# Patient Record
Sex: Female | Born: 1945 | Hispanic: No | Marital: Married | Smoking: Never smoker
Health system: Southern US, Community
[De-identification: ages and names within clinical notes are randomized; demographics above are authoritative.]

## PROBLEM LIST (undated history)

## (undated) DIAGNOSIS — M8580 Other specified disorders of bone density and structure, unspecified site: Secondary | ICD-10-CM

## (undated) DIAGNOSIS — D219 Benign neoplasm of connective and other soft tissue, unspecified: Secondary | ICD-10-CM

## (undated) DIAGNOSIS — E7800 Pure hypercholesterolemia, unspecified: Secondary | ICD-10-CM

## (undated) DIAGNOSIS — K219 Gastro-esophageal reflux disease without esophagitis: Secondary | ICD-10-CM

## (undated) DIAGNOSIS — B009 Herpesviral infection, unspecified: Secondary | ICD-10-CM

## (undated) DIAGNOSIS — J189 Pneumonia, unspecified organism: Secondary | ICD-10-CM

## (undated) HISTORY — PX: BREAST SURGERY: SHX581

## (undated) HISTORY — DX: Gastro-esophageal reflux disease without esophagitis: K21.9

## (undated) HISTORY — DX: Benign neoplasm of connective and other soft tissue, unspecified: D21.9

## (undated) HISTORY — PX: TONSILLECTOMY: SUR1361

## (undated) HISTORY — PX: VAGINAL HYSTERECTOMY: SUR661

## (undated) HISTORY — DX: Herpesviral infection, unspecified: B00.9

## (undated) HISTORY — DX: Pure hypercholesterolemia, unspecified: E78.00

## (undated) HISTORY — PX: OTHER SURGICAL HISTORY: SHX169

## (undated) HISTORY — DX: Pneumonia, unspecified organism: J18.9

## (undated) HISTORY — DX: Other specified disorders of bone density and structure, unspecified site: M85.80

---

## 1998-05-05 ENCOUNTER — Other Ambulatory Visit: Admission: RE | Admit: 1998-05-05 | Discharge: 1998-05-05 | Payer: Self-pay | Admitting: Obstetrics and Gynecology

## 1999-10-09 ENCOUNTER — Other Ambulatory Visit: Admission: RE | Admit: 1999-10-09 | Discharge: 1999-10-09 | Payer: Self-pay | Admitting: Obstetrics and Gynecology

## 2000-10-24 ENCOUNTER — Other Ambulatory Visit: Admission: RE | Admit: 2000-10-24 | Discharge: 2000-10-24 | Payer: Self-pay | Admitting: Obstetrics and Gynecology

## 2002-02-13 ENCOUNTER — Other Ambulatory Visit: Admission: RE | Admit: 2002-02-13 | Discharge: 2002-02-13 | Payer: Self-pay | Admitting: Obstetrics and Gynecology

## 2003-11-14 ENCOUNTER — Other Ambulatory Visit: Admission: RE | Admit: 2003-11-14 | Discharge: 2003-11-14 | Payer: Self-pay | Admitting: Obstetrics and Gynecology

## 2004-04-20 ENCOUNTER — Ambulatory Visit (HOSPITAL_COMMUNITY): Admission: RE | Admit: 2004-04-20 | Discharge: 2004-04-20 | Payer: Self-pay | Admitting: *Deleted

## 2004-12-28 ENCOUNTER — Other Ambulatory Visit: Admission: RE | Admit: 2004-12-28 | Discharge: 2004-12-28 | Payer: Self-pay | Admitting: Obstetrics and Gynecology

## 2006-02-28 ENCOUNTER — Other Ambulatory Visit: Admission: RE | Admit: 2006-02-28 | Discharge: 2006-02-28 | Payer: Self-pay | Admitting: Obstetrics and Gynecology

## 2007-04-03 ENCOUNTER — Other Ambulatory Visit: Admission: RE | Admit: 2007-04-03 | Discharge: 2007-04-03 | Payer: Self-pay | Admitting: Obstetrics and Gynecology

## 2008-04-30 ENCOUNTER — Other Ambulatory Visit: Admission: RE | Admit: 2008-04-30 | Discharge: 2008-04-30 | Payer: Self-pay | Admitting: Obstetrics and Gynecology

## 2008-04-30 ENCOUNTER — Ambulatory Visit: Payer: Self-pay | Admitting: Obstetrics and Gynecology

## 2008-04-30 ENCOUNTER — Encounter: Payer: Self-pay | Admitting: Obstetrics and Gynecology

## 2009-05-15 ENCOUNTER — Ambulatory Visit: Payer: Self-pay | Admitting: Obstetrics and Gynecology

## 2009-05-15 ENCOUNTER — Other Ambulatory Visit: Admission: RE | Admit: 2009-05-15 | Discharge: 2009-05-15 | Payer: Self-pay | Admitting: Obstetrics and Gynecology

## 2009-05-27 ENCOUNTER — Ambulatory Visit: Payer: Self-pay | Admitting: Obstetrics and Gynecology

## 2010-06-29 ENCOUNTER — Other Ambulatory Visit: Payer: Self-pay | Admitting: Obstetrics and Gynecology

## 2010-06-29 ENCOUNTER — Other Ambulatory Visit (HOSPITAL_COMMUNITY)
Admission: RE | Admit: 2010-06-29 | Discharge: 2010-06-29 | Disposition: A | Discharge status: Home / Self Care | Payer: MEDICARE | Source: Ambulatory Visit | Attending: Obstetrics and Gynecology | Admitting: Obstetrics and Gynecology

## 2010-06-29 ENCOUNTER — Encounter (INDEPENDENT_AMBULATORY_CARE_PROVIDER_SITE_OTHER): Payer: MEDICARE | Admitting: Obstetrics and Gynecology

## 2010-06-29 DIAGNOSIS — N951 Menopausal and female climacteric states: Secondary | ICD-10-CM

## 2010-06-29 DIAGNOSIS — N952 Postmenopausal atrophic vaginitis: Secondary | ICD-10-CM

## 2010-06-29 DIAGNOSIS — B373 Candidiasis of vulva and vagina: Secondary | ICD-10-CM

## 2010-06-29 DIAGNOSIS — E559 Vitamin D deficiency, unspecified: Secondary | ICD-10-CM

## 2010-06-29 DIAGNOSIS — Z124 Encounter for screening for malignant neoplasm of cervix: Secondary | ICD-10-CM

## 2010-06-29 DIAGNOSIS — R350 Frequency of micturition: Secondary | ICD-10-CM

## 2010-08-28 NOTE — Op Note (Signed)
NAMEHARRIETT, [name]:  [id number]   MEDICAL RECORD NO.:  [id number]          PATIENT TYPE:  AMB   LOCATION:  ENDO                         FACILITY:  [facility]   PHYSICIAN:  Georgiana Spinner, M.D.    DATE OF BIRTH:  01/27/1946   DATE OF PROCEDURE:  04/20/2004  DATE OF DISCHARGE:                                 OPERATIVE REPORT   PROCEDURE:  Colonoscopy.   INDICATION:  Cancer screening.   ANESTHESIA:  Demerol 60, Versed 7 mg.   PROCEDURE:  With the patient mildly sedated in the left lateral decubitus  position, the Olympus videoscopic colonoscope was inserted into the rectum  and advanced under direct vision to the cecum identified by the base of the  cecum which was photographed and ileocecal valve.  From this point, the  colonoscope was slowly withdrawn taking circumferential views of colonic  mucosa, stopping in the rectum which appeared normal and the rectum showed  hemorrhoids in retroflexed view.  The endoscope was straightened and  withdrawn.  The patient's vital signs and pulse oximetry remained stable.  The patient tolerated the procedure well without apparent complications.   FINDINGS:  Internal hemorrhoids, otherwise an unremarkable colonoscopic  examination to the cecum.   PLAN:  Consider repeat examination in 5-10 years.      GMO/MEDQ  D:  04/20/2004  T:  04/20/2004  Job:  [id number]

## 2011-04-13 DIAGNOSIS — J189 Pneumonia, unspecified organism: Secondary | ICD-10-CM

## 2011-04-13 HISTORY — DX: Pneumonia, unspecified organism: J18.9

## 2011-07-08 DIAGNOSIS — J069 Acute upper respiratory infection, unspecified: Secondary | ICD-10-CM | POA: Diagnosis not present

## 2011-07-19 DIAGNOSIS — J209 Acute bronchitis, unspecified: Secondary | ICD-10-CM | POA: Diagnosis not present

## 2011-07-19 DIAGNOSIS — R509 Fever, unspecified: Secondary | ICD-10-CM | POA: Diagnosis not present

## 2011-07-19 DIAGNOSIS — J984 Other disorders of lung: Secondary | ICD-10-CM | POA: Diagnosis not present

## 2011-07-22 DIAGNOSIS — J189 Pneumonia, unspecified organism: Secondary | ICD-10-CM | POA: Diagnosis not present

## 2011-07-22 DIAGNOSIS — R062 Wheezing: Secondary | ICD-10-CM | POA: Diagnosis not present

## 2011-07-28 DIAGNOSIS — J189 Pneumonia, unspecified organism: Secondary | ICD-10-CM | POA: Diagnosis not present

## 2011-08-06 DIAGNOSIS — R059 Cough, unspecified: Secondary | ICD-10-CM | POA: Diagnosis not present

## 2011-08-06 DIAGNOSIS — R5381 Other malaise: Secondary | ICD-10-CM | POA: Diagnosis not present

## 2011-08-06 DIAGNOSIS — R5383 Other fatigue: Secondary | ICD-10-CM | POA: Diagnosis not present

## 2011-08-06 DIAGNOSIS — H10029 Other mucopurulent conjunctivitis, unspecified eye: Secondary | ICD-10-CM | POA: Diagnosis not present

## 2011-08-06 DIAGNOSIS — J029 Acute pharyngitis, unspecified: Secondary | ICD-10-CM | POA: Diagnosis not present

## 2011-08-06 DIAGNOSIS — R05 Cough: Secondary | ICD-10-CM | POA: Diagnosis not present

## 2011-08-12 DIAGNOSIS — H109 Unspecified conjunctivitis: Secondary | ICD-10-CM | POA: Diagnosis not present

## 2011-08-12 DIAGNOSIS — G4700 Insomnia, unspecified: Secondary | ICD-10-CM | POA: Diagnosis not present

## 2011-08-12 DIAGNOSIS — J189 Pneumonia, unspecified organism: Secondary | ICD-10-CM | POA: Diagnosis not present

## 2011-09-13 ENCOUNTER — Encounter: Payer: Self-pay | Admitting: Gynecology

## 2011-09-13 DIAGNOSIS — B009 Herpesviral infection, unspecified: Secondary | ICD-10-CM | POA: Insufficient documentation

## 2011-09-16 DIAGNOSIS — Z1231 Encounter for screening mammogram for malignant neoplasm of breast: Secondary | ICD-10-CM | POA: Diagnosis not present

## 2011-09-23 ENCOUNTER — Encounter: Payer: Self-pay | Admitting: Obstetrics and Gynecology

## 2011-09-24 ENCOUNTER — Ambulatory Visit (INDEPENDENT_AMBULATORY_CARE_PROVIDER_SITE_OTHER): Payer: MEDICARE | Admitting: Obstetrics and Gynecology

## 2011-09-24 ENCOUNTER — Encounter: Payer: Self-pay | Admitting: Obstetrics and Gynecology

## 2011-09-24 VITALS — BP 124/70 | Ht 64.0 in | Wt 142.0 lb

## 2011-09-24 DIAGNOSIS — R350 Frequency of micturition: Secondary | ICD-10-CM

## 2011-09-24 DIAGNOSIS — E7800 Pure hypercholesterolemia, unspecified: Secondary | ICD-10-CM | POA: Insufficient documentation

## 2011-09-24 NOTE — Progress Notes (Signed)
Patient came to see me today for further followup. She continues to get hot flashes but feels they're tolerable without hormone replacement. She is on Zoloft and I suspect that modifiers her symptoms. She does have vaginal dryness and is using her replens with good results. She is having no vaginal bleeding. She is having no pelvic pain. She does have urinary frequency without nocturia. She will lose urine with coughing with an upper respiratory infection and does do Kegel's. She is having no dysuria or urgency. She is up-to-date on mammograms. She's had 2 normal bone densities. She does her lab through PCP. She just was treated for pneumonia which required 3 different antibiotics to resolve. She is status post vaginal hysterectomy and has never had an abnormal Pap smear. 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  ROS: 12 system review done. Pertinent positives above. Only other positive is hyperlipidemia.  HEENT: Within normal limits. Kennon Portela present. Neck: No masses. Supraclavicular lymph nodes: Not enlarged. Breasts: Examined in both sitting and lying position. Symmetrical without skin changes or masses. Abdomen: Soft no masses guarding or rebound. No hernias. Pelvic: External within normal limits. BUS within normal limits. Vaginal examination shows poor estrogen effect, no cystocele enterocele or rectocele. Cervix and uterus absent. Adnexa within normal limits. Rectovaginal confirmatory. Extremities within normal limits.  Assessment: #1. Menopausal symptoms #2. Atrophic vaginitis #3. Urinary frequency #4. Stress incontinence with upper respiratory infections  Plan: Continue yearly mammograms. Continue replens. Continue Zoloft. Continue Kegel exercises.

## 2011-09-25 LAB — URINALYSIS W MICROSCOPIC + REFLEX CULTURE
Bilirubin Urine: NEGATIVE
Casts: NONE SEEN
Crystals: NONE SEEN
Glucose, UA: NEGATIVE mg/dL
Hgb urine dipstick: NEGATIVE
Ketones, ur: NEGATIVE mg/dL
Leukocytes, UA: NEGATIVE
Nitrite: NEGATIVE
Protein, ur: NEGATIVE mg/dL
Specific Gravity, Urine: 1.012 (ref 1.005–1.030)
Urobilinogen, UA: 0.2 mg/dL (ref 0.0–1.0)
pH: 7.5 (ref 5.0–8.0)

## 2011-10-01 ENCOUNTER — Encounter: Payer: Self-pay | Admitting: Obstetrics and Gynecology

## 2012-01-03 DIAGNOSIS — R002 Palpitations: Secondary | ICD-10-CM | POA: Diagnosis not present

## 2012-01-07 DIAGNOSIS — Z23 Encounter for immunization: Secondary | ICD-10-CM | POA: Diagnosis not present

## 2012-02-02 DIAGNOSIS — D239 Other benign neoplasm of skin, unspecified: Secondary | ICD-10-CM | POA: Diagnosis not present

## 2012-02-02 DIAGNOSIS — L80 Vitiligo: Secondary | ICD-10-CM | POA: Diagnosis not present

## 2012-02-02 DIAGNOSIS — L821 Other seborrheic keratosis: Secondary | ICD-10-CM | POA: Diagnosis not present

## 2012-05-11 DIAGNOSIS — IMO0002 Reserved for concepts with insufficient information to code with codable children: Secondary | ICD-10-CM | POA: Diagnosis not present

## 2012-10-19 DIAGNOSIS — Z1231 Encounter for screening mammogram for malignant neoplasm of breast: Secondary | ICD-10-CM | POA: Diagnosis not present

## 2012-10-20 ENCOUNTER — Encounter: Payer: Self-pay | Admitting: Women's Health

## 2012-10-26 ENCOUNTER — Ambulatory Visit (INDEPENDENT_AMBULATORY_CARE_PROVIDER_SITE_OTHER): Payer: MEDICARE | Admitting: Women's Health

## 2012-10-26 ENCOUNTER — Encounter: Payer: Self-pay | Admitting: Women's Health

## 2012-10-26 VITALS — BP 116/78 | Ht 64.0 in | Wt 145.0 lb

## 2012-10-26 DIAGNOSIS — Z780 Asymptomatic menopausal state: Secondary | ICD-10-CM

## 2012-10-26 DIAGNOSIS — B009 Herpesviral infection, unspecified: Secondary | ICD-10-CM | POA: Diagnosis not present

## 2012-10-26 MED ORDER — VALACYCLOVIR HCL 500 MG PO TABS: 500.0000 mg | ORAL_TABLET | Freq: Two times a day (BID) | ORAL | Status: DC

## 2012-10-26 NOTE — Patient Instructions (Addendum)
Health Recommendations for Postmenopausal Women Respected and ongoing research has looked at the most common causes of death, disability, and poor quality of life in postmenopausal women. The causes include heart disease, diseases of blood vessels, diabetes, depression, cancer, and bone loss (osteoporosis). Many things can be done to help lower the chances of developing these and other common problems: CARDIOVASCULAR DISEASE Heart Disease: A heart attack is a medical emergency. Know the signs and symptoms of a heart attack. Below are things women can do to reduce their risk for heart disease.   Do not smoke. If you smoke, quit.  Aim for a healthy weight. Being overweight causes many preventable deaths. Eat a healthy and balanced diet and drink an adequate amount of liquids.  Get moving. Make a commitment to be more physically active. Aim for 30 minutes of activity on most, if not all days of the week.  Eat for heart health. Choose a diet that is low in saturated fat and cholesterol and eliminate trans fat. Include whole grains, vegetables, and fruits. Read and understand the labels on food containers before buying.  Know your numbers. Ask your caregiver to check your blood pressure, cholesterol (total, HDL, LDL, triglycerides) and blood glucose. Work with your caregiver on improving your entire clinical picture.  High blood pressure. Limit or stop your table salt intake (try salt substitute and food seasonings). Avoid salty foods and drinks. Read labels on food containers before buying. Eating well and exercising can help control high blood pressure. STROKE  Stroke is a medical emergency. Stroke may be the result of a blood clot in a blood vessel in the brain or by a brain hemorrhage (bleeding). Know the signs and symptoms of a stroke. To lower the risk of developing a stroke:  Avoid fatty foods.  Quit smoking.  Control your diabetes, blood pressure, and irregular heart rate. THROMBOPHLEBITIS  (BLOOD CLOT) OF THE LEG  Becoming overweight and leading a stationary lifestyle may also contribute to developing blood clots. Controlling your diet and exercising will help lower the risk of developing blood clots. CANCER SCREENING  Breast Cancer: Take steps to reduce your risk of breast cancer.  You should practice "breast self-awareness." This means understanding the normal appearance and feel of your breasts and should include breast self-examination. Any changes detected, no matter how small, should be reported to your caregiver.  After age 40, you should have a clinical breast exam (CBE) every year.  Starting at age 40, you should consider having a mammogram (breast X-ray) every year.  If you have a family history of breast cancer, talk to your caregiver about genetic screening.  If you are at high risk for breast cancer, talk to your caregiver about having an MRI and a mammogram every year.  Intestinal or Stomach Cancer: Tests to consider are a rectal exam, fecal occult blood, sigmoidoscopy, and colonoscopy. Women who are high risk may need to be screened at an earlier age and more often.  Cervical Cancer:  Beginning at age 30, you should have a Pap test every 3 years as long as the past 3 Pap tests have been normal.  If you have had past treatment for cervical cancer or a condition that could lead to cancer, you need Pap tests and screening for cancer for at least 20 years after your treatment.  If you had a hysterectomy for a problem that was not cancer or a condition that could lead to cancer, then you no longer need Pap tests.    If you are between ages 65 and 70, and you have had normal Pap tests going back 10 years, you no longer need Pap tests.  If Pap tests have been discontinued, risk factors (such as a new sexual partner) need to be reassessed to determine if screening should be resumed.  Some medical problems can increase the chance of getting cervical cancer. In these  cases, your caregiver may recommend more frequent screening and Pap tests.  Uterine Cancer: If you have vaginal bleeding after reaching menopause, you should notify your caregiver.  Ovarian cancer: Other than yearly pelvic exams, there are no reliable tests available to screen for ovarian cancer at this time except for yearly pelvic exams.  Lung Cancer: Yearly chest X-rays can detect lung cancer and should be done on high risk women, such as cigarette smokers and women with chronic lung disease (emphysema).  Skin Cancer: A complete body skin exam should be done at your yearly examination. Avoid overexposure to the sun and ultraviolet light lamps. Use a strong sun block cream when in the sun. All of these things are important in lowering the risk of skin cancer. MENOPAUSE Menopause Symptoms: Hormone therapy products are effective for treating symptoms associated with menopause:  Moderate to severe hot flashes.  Night sweats.  Mood swings.  Headaches.  Tiredness.  Loss of sex drive.  Insomnia.  Other symptoms. Hormone replacement carries certain risks, especially in older women. Women who use or are thinking about using estrogen or estrogen with progestin treatments should discuss that with their caregiver. Your caregiver will help you understand the benefits and risks. The ideal dose of hormone replacement therapy is not known. The Food and Drug Administration (FDA) has concluded that hormone therapy should be used only at the lowest doses and for the shortest amount of time to reach treatment goals.  OSTEOPOROSIS Protecting Against Bone Loss and Preventing Fracture: If you use hormone therapy for prevention of bone loss (osteoporosis), the risks for bone loss must outweigh the risk of the therapy. Ask your caregiver about other medications known to be safe and effective for preventing bone loss and fractures. To guard against bone loss or fractures, the following is recommended:  If  you are less than age 50, take 1000 mg of calcium and at least 600 mg of Vitamin D per day.  If you are greater than age 50 but less than age 70, take 1200 mg of calcium and at least 600 mg of Vitamin D per day.  If you are greater than age 70, take 1200 mg of calcium and at least 800 mg of Vitamin D per day. Smoking and excessive alcohol intake increases the risk of osteoporosis. Eat foods rich in calcium and vitamin D and do weight bearing exercises several times a week as your caregiver suggests. DIABETES Diabetes Melitus: If you have Type I or Type 2 diabetes, you should keep your blood sugar under control with diet, exercise and recommended medication. Avoid too many sweets, starchy and fatty foods. Being overweight can make control more difficult. COGNITION AND MEMORY Cognition and Memory: Menopausal hormone therapy is not recommended for the prevention of cognitive disorders such as Alzheimer's disease or memory loss.  DEPRESSION  Depression may occur at any age, but is common in elderly women. The reasons may be because of physical, medical, social (loneliness), or financial problems and needs. If you are experiencing depression because of medical problems and control of symptoms, talk to your caregiver about this. Physical activity and   exercise may help with mood and sleep. Community and volunteer involvement may help your sense of value and worth. If you have depression and you feel that the problem is getting worse or becoming severe, talk to your caregiver about treatment options that are best for you. ACCIDENTS  Accidents are common and can be serious in the elderly woman. Prepare your house to prevent accidents. Eliminate throw rugs, place hand bars in the bath, shower and toilet areas. Avoid wearing high heeled shoes or walking on wet, snowy, and icy areas. Limit or stop driving if you have vision or hearing problems, or you feel you are unsteady with you movements and  reflexes. HEPATITIS C Hepatitis C is a type of viral infection affecting the liver. It is spread mainly through contact with blood from an infected person. It can be treated, but if left untreated, it can lead to severe liver damage over years. Many people who are infected do not know that the virus is in their blood. If you are a "baby-boomer", it is recommended that you have one screening test for Hepatitis C. IMMUNIZATIONS  Several immunizations are important to consider having during your senior years, including:   Tetanus, diptheria, and pertussis booster shot.  Influenza every year before the flu season begins.  Pneumonia vaccine.  Shingles vaccine.  Others as indicated based on your specific needs. Talk to your caregiver about these. Document Released: 05/21/2005 Document Revised: 03/15/2012 Document Reviewed: 01/15/2008 [employer] Patient Information 2014 [employer].  

## 2012-10-26 NOTE — Progress Notes (Signed)
Vicki Quinn 1945-08-09 [id number]    History:    The patient presents for Breast and pelvic exam. TVH for fibroids/ no HRT. HSV 1 rare outbreaks uses Valtrex. Normal Pap and mammogram history. Normal DEXA 2008. Negative colonoscopy approximately 7 years ago. OCD and hypercholesteremia managed by primary care.  Past medical history, past surgical history, family history and social history were all reviewed and documented in the EPIC chart. Retired Higher education careers adviser. Maternal grandmother died from breast cancer in her 30s.  Exam:  Filed Vitals:   10/26/12 1058  BP: 116/78    General appearance:  Normal Head/Neck:  Normal, without cervical or supraclavicular adenopathy. Thyroid:  Symmetrical, normal in size, without palpable masses or nodularity. Respiratory  Effort:  Normal  Auscultation:  Clear without wheezing or rhonchi Cardiovascular  Auscultation:  Regular rate, without rubs, murmurs or gallops  Edema/varicosities:  Not grossly evident Abdominal  Soft,nontender, without masses, guarding or rebound.  Liver/spleen:  No organomegaly noted  Hernia:  None appreciated  Skin  Inspection:  Grossly normal  Palpation:  Grossly normal Neurologic/psychiatric  Orientation:  Normal with appropriate conversation.  Mood/affect:  Normal  Genitourinary    Breasts: Examined lying and sitting.     Right: Without masses, retractions, discharge or axillary adenopathy.     Left: Without masses, retractions, discharge or axillary adenopathy.   Inguinal/mons:  Normal without inguinal adenopathy  External genitalia:  Normal  BUS/Urethra/Skene's glands:  Normal  Bladder:  Normal  Vagina:  atrophic  Cervix: absent  Uterus:  absent  Adnexa/parametria:     Rt: Without masses or tenderness.   Lt: Without masses or tenderness.  Anus and perineum: Normal  Digital rectal exam: Normal sphincter tone without palpated masses or tenderness  Assessment/Plan:  67 y.o. M WF G0 for breast and pelvic  exam.  TVH/fibroids/no HRT Vaginal atrophy Hypercholesteremia/OCD-primary care labs and meds HSV 1  Plan: Valtrex 500 twice daily as needed prescription, proper use given and reviewed. Schedule bone density. Home safety, fall prevention and importance of exercise reviewed in relationship to bone health. SBE's, continue annual mammogram, review breast tissue dense, reviewed and encouraged 3-D tomography, vitamin D 2000 daily encouraged. Pap guidelines reviewed. Reviewed importance of vaginal lubricants. Home Hemoccult card given with instructions.    Harrington Challenger WHNP, 1:04 PM 10/26/2012

## 2012-11-22 ENCOUNTER — Other Ambulatory Visit: Payer: Self-pay | Admitting: Gynecology

## 2012-11-22 DIAGNOSIS — Z780 Asymptomatic menopausal state: Secondary | ICD-10-CM

## 2012-12-14 ENCOUNTER — Encounter: Payer: Self-pay | Admitting: Gynecology

## 2012-12-14 ENCOUNTER — Ambulatory Visit (INDEPENDENT_AMBULATORY_CARE_PROVIDER_SITE_OTHER): Payer: MEDICARE

## 2012-12-14 DIAGNOSIS — M8580 Other specified disorders of bone density and structure, unspecified site: Secondary | ICD-10-CM

## 2012-12-14 DIAGNOSIS — Z780 Asymptomatic menopausal state: Secondary | ICD-10-CM

## 2012-12-14 DIAGNOSIS — M899 Disorder of bone, unspecified: Secondary | ICD-10-CM

## 2012-12-28 ENCOUNTER — Other Ambulatory Visit: Payer: Self-pay | Admitting: *Deleted

## 2012-12-28 DIAGNOSIS — M8580 Other specified disorders of bone density and structure, unspecified site: Secondary | ICD-10-CM

## 2013-01-01 ENCOUNTER — Other Ambulatory Visit: Payer: MEDICARE

## 2013-01-01 DIAGNOSIS — M899 Disorder of bone, unspecified: Secondary | ICD-10-CM | POA: Diagnosis not present

## 2013-01-01 DIAGNOSIS — M8580 Other specified disorders of bone density and structure, unspecified site: Secondary | ICD-10-CM

## 2013-01-02 LAB — VITAMIN D 25 HYDROXY (VIT D DEFICIENCY, FRACTURES): Vit D, 25-Hydroxy: 53 ng/mL (ref 30–89)

## 2013-01-26 DIAGNOSIS — Z23 Encounter for immunization: Secondary | ICD-10-CM | POA: Diagnosis not present

## 2013-02-15 ENCOUNTER — Other Ambulatory Visit: Payer: Self-pay

## 2013-08-07 DIAGNOSIS — L80 Vitiligo: Secondary | ICD-10-CM | POA: Diagnosis not present

## 2013-08-07 DIAGNOSIS — D239 Other benign neoplasm of skin, unspecified: Secondary | ICD-10-CM | POA: Diagnosis not present

## 2013-10-03 DIAGNOSIS — M25569 Pain in unspecified knee: Secondary | ICD-10-CM | POA: Diagnosis not present

## 2013-10-18 DIAGNOSIS — E7800 Pure hypercholesterolemia, unspecified: Secondary | ICD-10-CM | POA: Diagnosis not present

## 2013-10-18 DIAGNOSIS — Z0000 Encounter for general adult medical examination without abnormal findings: Secondary | ICD-10-CM | POA: Diagnosis not present

## 2013-10-18 DIAGNOSIS — E559 Vitamin D deficiency, unspecified: Secondary | ICD-10-CM | POA: Diagnosis not present

## 2013-10-18 DIAGNOSIS — Z23 Encounter for immunization: Secondary | ICD-10-CM | POA: Diagnosis not present

## 2013-10-22 ENCOUNTER — Ambulatory Visit (INDEPENDENT_AMBULATORY_CARE_PROVIDER_SITE_OTHER): Payer: MEDICARE | Admitting: Sports Medicine

## 2013-10-22 ENCOUNTER — Encounter: Payer: Self-pay | Admitting: Sports Medicine

## 2013-10-22 VITALS — BP 131/83 | Ht 64.0 in | Wt 144.0 lb

## 2013-10-22 DIAGNOSIS — M25561 Pain in right knee: Secondary | ICD-10-CM | POA: Insufficient documentation

## 2013-10-22 DIAGNOSIS — M25569 Pain in unspecified knee: Secondary | ICD-10-CM

## 2013-10-22 NOTE — Assessment & Plan Note (Addendum)
Suspect distal IT band  - Instructed patient on IT band stretches and hip exercises - Apply Aspercreme as needed - Reevaluate for 4-6 weeks - Will send correspondence to Dr. Shelia Media -Consider further diagnostic imaging if symptoms persist a followup. Patient will notify me in the interim of any swelling or mechanical symptoms which may develop -She may continue with activity as tolerated

## 2013-10-22 NOTE — Patient Instructions (Signed)
It was great seeing you today.   1. Daily hip strengthening exercises and IT band stretches  2. Apply Aspercreme  Next Appointment  Please call to make an appointment with Dr Micheline Chapman in 6 weeks    Take Care,   Dr Phill Myron

## 2013-10-22 NOTE — Progress Notes (Signed)
  [facility] [address] [place], [place] [address] Phone: [phone] Fax: [phone]   Patient Name: Vicki Quinn Date of Birth: 1946-03-11 Medical Record Number: [id number] Gender: female Date of Encounter: 10/22/2013  SUBJECTIVE:  Vicki Quinn is a 68 y.o. very pleasant female patient who presents with lateral right knee pain that has been occurring for several months.  She denies any recent or previous right knee or hip trauma.  The pain is only present when going upstairs or prolonged walking.  She denies any swelling, locking, or giving way.  She has not taken anything for the pain.   ROS:  denies any fevers chills night sweats. No weight loss.   DATA REVIEWED:   Reviewed Right knee lateral and AP x-rays obtained by PCP- No obvious fractures or arthritic changes  PERTINENT PMH / PSH FH / / SH:  Past Medical, Surgical, Social, and Family History Reviewed & Updated per EMR. Pertinent Historical Findings include:   None  OBJECTIVE:  Filed Vitals:   10/22/13 1034  BP: 131/83   Filed Vitals:   10/22/13 1033  Height: 5\' 4"  (1.626 m)  Weight: 144 lb (65.318 kg)   Body mass index is 24.71 kg/(m^2).  Rt Knee: Normal to inspection with no erythema or effusion or obvious bony abnormalities. Palpation: Tenderness along the distal IT band and insertion; No warmth, joint line tenderness, patellar tenderness ROM full in flexion and extension and lower leg rotation. Ligaments with solid consistent endpoints including ACL, PCL, LCL, MCL. Patellar glide with mild crepitus. Strength   Hamstring and quadriceps strength is normal  4/5 hip abduction / gluteus medius Negative Mcmurray's, Apley's, and Thessalonian tests. Non painful patellar compression.  ASSESSMENT & PLAN:  See problem based charting & AVS for pt instructions.  Phill Myron, MD  Patient seen and evaluated by Dr Micheline Chapman who agrees with the plan.

## 2013-10-24 DIAGNOSIS — B009 Herpesviral infection, unspecified: Secondary | ICD-10-CM | POA: Diagnosis not present

## 2013-10-24 DIAGNOSIS — H9190 Unspecified hearing loss, unspecified ear: Secondary | ICD-10-CM | POA: Diagnosis not present

## 2013-10-24 DIAGNOSIS — F429 Obsessive-compulsive disorder, unspecified: Secondary | ICD-10-CM | POA: Diagnosis not present

## 2013-10-24 DIAGNOSIS — E559 Vitamin D deficiency, unspecified: Secondary | ICD-10-CM | POA: Diagnosis not present

## 2013-12-03 ENCOUNTER — Encounter: Payer: Self-pay | Admitting: Sports Medicine

## 2013-12-03 ENCOUNTER — Ambulatory Visit (INDEPENDENT_AMBULATORY_CARE_PROVIDER_SITE_OTHER): Payer: MEDICARE | Admitting: Sports Medicine

## 2013-12-03 VITALS — BP 115/72 | HR 71 | Ht 64.0 in | Wt 145.0 lb

## 2013-12-03 DIAGNOSIS — M25569 Pain in unspecified knee: Secondary | ICD-10-CM | POA: Diagnosis not present

## 2013-12-03 DIAGNOSIS — M25561 Pain in right knee: Secondary | ICD-10-CM

## 2013-12-03 NOTE — Progress Notes (Signed)
   Subjective:    Patient ID: Vicki Quinn, female    DOB: February 24, 1946, 68 y.o.   MRN: [id number]  HPI Patient comes in today for followup on lateral right knee pain. Overall, she feels like she is improving. She has been performing her home exercises. She has also been using Aspercreme. She is slowly increasing her activity level. She continues to deny swelling in the knee. No mechanical symptoms.    Review of Systems     Objective:   Physical Exam Well-developed, well-nourished. No acute distress  Right knee: Full range of motion. No effusion. No soft tissue swelling. There is still some slight tenderness to palpation over the distal IT band at the lateral femoral condyle. Negative McMurray's. She still has moderate hip abductor weakness. Neurovascularly intact distally. Walking without a limp.       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 I look for symptoms to continue to improve to the point of resolution over the next few weeks. Patient is instructed to notify me if this does not occur. She can continue to use her Aspercreme when necessary as well. Followup for ongoing or recalcitrant issues.

## 2013-12-24 ENCOUNTER — Encounter: Payer: Self-pay | Admitting: *Deleted

## 2014-01-22 DIAGNOSIS — Z803 Family history of malignant neoplasm of breast: Secondary | ICD-10-CM | POA: Diagnosis not present

## 2014-01-22 DIAGNOSIS — Z1231 Encounter for screening mammogram for malignant neoplasm of breast: Secondary | ICD-10-CM | POA: Diagnosis not present

## 2014-01-24 ENCOUNTER — Encounter: Payer: Self-pay | Admitting: Women's Health

## 2014-01-25 DIAGNOSIS — Z23 Encounter for immunization: Secondary | ICD-10-CM | POA: Diagnosis not present

## 2014-01-30 ENCOUNTER — Encounter: Payer: Self-pay | Admitting: Women's Health

## 2014-01-30 ENCOUNTER — Ambulatory Visit (INDEPENDENT_AMBULATORY_CARE_PROVIDER_SITE_OTHER): Payer: MEDICARE | Admitting: Women's Health

## 2014-01-30 VITALS — BP 110/76 | Ht 64.0 in | Wt 145.0 lb

## 2014-01-30 DIAGNOSIS — Z01419 Encounter for gynecological examination (general) (routine) without abnormal findings: Secondary | ICD-10-CM

## 2014-01-30 NOTE — Patient Instructions (Signed)
Health Recommendations for Postmenopausal Women Respected and ongoing research has looked at the most common causes of death, disability, and poor quality of life in postmenopausal women. The causes include heart disease, diseases of blood vessels, diabetes, depression, cancer, and bone loss (osteoporosis). Many things can be done to help lower the chances of developing these and other common problems. CARDIOVASCULAR DISEASE Heart Disease: A heart attack is a medical emergency. Know the signs and symptoms of a heart attack. Below are things women can do to reduce their risk for heart disease.   Do not smoke. If you smoke, quit.  Aim for a healthy weight. Being overweight causes many preventable deaths. Eat a healthy and balanced diet and drink an adequate amount of liquids.  Get moving. Make a commitment to be more physically active. Aim for 30 minutes of activity on most, if not all days of the week.  Eat for heart health. Choose a diet that is low in saturated fat and cholesterol and eliminate trans fat. Include whole grains, vegetables, and fruits. Read and understand the labels on food containers before buying.  Know your numbers. Ask your caregiver to check your blood pressure, cholesterol (total, HDL, LDL, triglycerides) and blood glucose. Work with your caregiver on improving your entire clinical picture.  High blood pressure. Limit or stop your table salt intake (try salt substitute and food seasonings). Avoid salty foods and drinks. Read labels on food containers before buying. Eating well and exercising can help control high blood pressure. STROKE  Stroke is a medical emergency. Stroke may be the result of a blood clot in a blood vessel in the brain or by a brain hemorrhage (bleeding). Know the signs and symptoms of a stroke. To lower the risk of developing a stroke:  Avoid fatty foods.  Quit smoking.  Control your diabetes, blood pressure, and irregular heart rate. THROMBOPHLEBITIS  (BLOOD CLOT) OF THE LEG  Becoming overweight and leading a stationary lifestyle may also contribute to developing blood clots. Controlling your diet and exercising will help lower the risk of developing blood clots. CANCER SCREENING  Breast Cancer: Take steps to reduce your risk of breast cancer.  You should practice "breast self-awareness." This means understanding the normal appearance and feel of your breasts and should include breast self-examination. Any changes detected, no matter how small, should be reported to your caregiver.  After age 40, you should have a clinical breast exam (CBE) every year.  Starting at age 40, you should consider having a mammogram (breast X-ray) every year.  If you have a family history of breast cancer, talk to your caregiver about genetic screening.  If you are at high risk for breast cancer, talk to your caregiver about having an MRI and a mammogram every year.  Intestinal or Stomach Cancer: Tests to consider are a rectal exam, fecal occult blood, sigmoidoscopy, and colonoscopy. Women who are high risk may need to be screened at an earlier age and more often.  Cervical Cancer:  Beginning at age 30, you should have a Pap test every 3 years as long as the past 3 Pap tests have been normal.  If you have had past treatment for cervical cancer or a condition that could lead to cancer, you need Pap tests and screening for cancer for at least 20 years after your treatment.  If you had a hysterectomy for a problem that was not cancer or a condition that could lead to cancer, then you no longer need Pap tests.    If you are between ages 65 and 70, and you have had normal Pap tests going back 10 years, you no longer need Pap tests.  If Pap tests have been discontinued, risk factors (such as a new sexual partner) need to be reassessed to determine if screening should be resumed.  Some medical problems can increase the chance of getting cervical cancer. In these  cases, your caregiver may recommend more frequent screening and Pap tests.  Uterine Cancer: If you have vaginal bleeding after reaching menopause, you should notify your caregiver.  Ovarian Cancer: Other than yearly pelvic exams, there are no reliable tests available to screen for ovarian cancer at this time except for yearly pelvic exams.  Lung Cancer: Yearly chest X-rays can detect lung cancer and should be done on high risk women, such as cigarette smokers and women with chronic lung disease (emphysema).  Skin Cancer: A complete body skin exam should be done at your yearly examination. Avoid overexposure to the sun and ultraviolet light lamps. Use a strong sun block cream when in the sun. All of these things are important for lowering the risk of skin cancer. MENOPAUSE Menopause Symptoms: Hormone therapy products are effective for treating symptoms associated with menopause:  Moderate to severe hot flashes.  Night sweats.  Mood swings.  Headaches.  Tiredness.  Loss of sex drive.  Insomnia.  Other symptoms. Hormone replacement carries certain risks, especially in older women. Women who use or are thinking about using estrogen or estrogen with progestin treatments should discuss that with their caregiver. Your caregiver will help you understand the benefits and risks. The ideal dose of hormone replacement therapy is not known. The Food and Drug Administration (FDA) has concluded that hormone therapy should be used only at the lowest doses and for the shortest amount of time to reach treatment goals.  OSTEOPOROSIS Protecting Against Bone Loss and Preventing Fracture If you use hormone therapy for prevention of bone loss (osteoporosis), the risks for bone loss must outweigh the risk of the therapy. Ask your caregiver about other medications known to be safe and effective for preventing bone loss and fractures. To guard against bone loss or fractures, the following is recommended:  If  you are younger than age 50, take 1000 mg of calcium and at least 600 mg of Vitamin D per day.  If you are older than age 50 but younger than age 70, take 1200 mg of calcium and at least 600 mg of Vitamin D per day.  If you are older than age 70, take 1200 mg of calcium and at least 800 mg of Vitamin D per day. Smoking and excessive alcohol intake increases the risk of osteoporosis. Eat foods rich in calcium and vitamin D and do weight bearing exercises several times a week as your caregiver suggests. DIABETES Diabetes Mellitus: If you have type I or type 2 diabetes, you should keep your blood sugar under control with diet, exercise, and recommended medication. Avoid starchy and fatty foods, and too many sweets. Being overweight can make diabetes control more difficult. COGNITION AND MEMORY Cognition and Memory: Menopausal hormone therapy is not recommended for the prevention of cognitive disorders such as Alzheimer's disease or memory loss.  DEPRESSION  Depression may occur at any age, but it is common in elderly women. This may be because of physical, medical, social (loneliness), or financial problems and needs. If you are experiencing depression because of medical problems and control of symptoms, talk to your caregiver about this. Physical   activity and exercise may help with mood and sleep. Community and volunteer involvement may improve your sense of value and worth. If you have depression and you feel that the problem is getting worse or becoming severe, talk to your caregiver about which treatment options are best for you. ACCIDENTS  Accidents are common and can be serious in elderly woman. Prepare your house to prevent accidents. Eliminate throw rugs, place hand bars in bath, shower, and toilet areas. Avoid wearing high heeled shoes or walking on wet, snowy, and icy areas. Limit or stop driving if you have vision or hearing problems, or if you feel you are unsteady with your movements and  reflexes. HEPATITIS C Hepatitis C is a type of viral infection affecting the liver. It is spread mainly through contact with blood from an infected person. It can be treated, but if left untreated, it can lead to severe liver damage over the years. Many people who are infected do not know that the virus is in their blood. If you are a "baby-boomer", it is recommended that you have one screening test for Hepatitis C. IMMUNIZATIONS  Several immunizations are important to consider having during your senior years, including:   Tetanus, diphtheria, and pertussis booster shot.  Influenza every year before the flu season begins.  Pneumonia vaccine.  Shingles vaccine.  Others, as indicated based on your specific needs. Talk to your caregiver about these. Document Released: 05/21/2005 Document Revised: 08/13/2013 Document Reviewed: 01/15/2008 Exit[employer] Patient Information 2015 [employer]. This information is not intended to replace advice given to you by your health care provider. Make sure you discuss any questions you have with your health care provider.  

## 2014-01-30 NOTE — Progress Notes (Signed)
Vicki Quinn 03-Feb-1946 [id number]    History:    Presents for breast and pelvic exam. TVH for fibroids on no HRT. HSV 1 rare outbreaks. Normal Pap and mammogram history. Hypercholesterolemia managed by primary care. Negative colonoscopy age 68. Maternal grandmother breast cancer in her 91s and died. 2012/08/06 DEXA T score -1.7 on the right femoral neck FRAX 10%/1.4%. Current on immunizations.  Past medical history, past surgical history, family history and social history were all reviewed and documented in the EPIC chart. Retired Engineer, materials.  ROS:  A  12 point ROS was performed and pertinent positives and negatives are included.  Exam:  Filed Vitals:   01/30/14 1143  BP: 110/76    General appearance:  Normal Thyroid:  Symmetrical, normal in size, without palpable masses or nodularity. Respiratory  Auscultation:  Clear without wheezing or rhonchi Cardiovascular  Auscultation:  Regular rate, without rubs, murmurs or gallops  Edema/varicosities:  Not grossly evident Abdominal  Soft,nontender, without masses, guarding or rebound.  Liver/spleen:  No organomegaly noted  Hernia:  None appreciated  Skin  Inspection:  Grossly normal   Breasts: Examined lying and sitting.     Right: Without masses, retractions, discharge or axillary adenopathy.     Left: Without masses, retractions, discharge or axillary adenopathy. Gentitourinary   Inguinal/mons:  Normal without inguinal adenopathy  External genitalia:  Normal  BUS/Urethra/Skene's glands:  Normal  Vagina:  Atrophic  Cervix:  Absent Uterus:  Absent Adnexa/parametria:     Rt: Without masses or tenderness.   Lt: Without masses or tenderness.  Anus and perineum: Normal  Digital rectal exam: Normal sphincter tone without palpated masses or tenderness  Assessment/Plan:  68 y.o. MWF G0 for breast and pelvic exam with no complaints.  TVH fibroids Osteopenia without elevated FRAX HSV 1 rare  outbreaks Hypercholesteremia-primary care manages labs and meds Vaginal atrophy  Plan: SBE's, continue annual 3-D mammogram, history of dense breast. Continue active lifestyle, regular exercise, yoga, calcium rich foods, vitamin D 1000 daily, vitamin D 53 2014. Home safety, fall prevention and importance of regular exercise reviewed. Continue vaginal lubricants for intercourse    Huel Cote [facility], 12:19 PM 01/30/2014

## 2014-01-31 LAB — URINALYSIS W MICROSCOPIC + REFLEX CULTURE
Bacteria, UA: NONE SEEN
Bilirubin Urine: NEGATIVE
Casts: NONE SEEN
Crystals: NONE SEEN
Glucose, UA: NEGATIVE mg/dL
Hgb urine dipstick: NEGATIVE
Ketones, ur: NEGATIVE mg/dL
Leukocytes, UA: NEGATIVE
Nitrite: NEGATIVE
Protein, ur: NEGATIVE mg/dL
Specific Gravity, Urine: 1.019 (ref 1.005–1.030)
Squamous Epithelial / LPF: NONE SEEN
Urobilinogen, UA: 0.2 mg/dL (ref 0.0–1.0)
pH: 7.5 (ref 5.0–8.0)

## 2014-02-15 ENCOUNTER — Ambulatory Visit (INDEPENDENT_AMBULATORY_CARE_PROVIDER_SITE_OTHER): Payer: MEDICARE | Admitting: Sports Medicine

## 2014-02-15 ENCOUNTER — Encounter: Payer: Self-pay | Admitting: Sports Medicine

## 2014-02-15 VITALS — BP 115/71 | HR 75 | Ht 64.0 in | Wt 145.0 lb

## 2014-02-15 DIAGNOSIS — M25561 Pain in right knee: Secondary | ICD-10-CM

## 2014-02-15 MED ORDER — MELOXICAM 15 MG PO TABS: ORAL_TABLET | ORAL | Status: DC

## 2014-02-15 NOTE — Progress Notes (Signed)
   Subjective:    Patient ID: Vicki Quinn, female    DOB: 06-30-1945, 68 y.o.   MRN: [id number]  HPIchief complaint: Right knee pain  Patient comes in today with returning lateral right knee pain. I last saw her several weeks ago and diagnosed her with iliotibial band syndrome. She was improving with home exercise program. She was doing well up until a week and a half ago when she was simply getting up from a seated position and felt a sharp pain along the lateral aspect of her knee. Since then she's had pain with walking as well as with twisting on the knee. She initially noticed some decreased flexion of the knee but that is improving. She has not noticed any swelling. She denies mechanical symptoms. She states that her current pain is different in nature than what she was experiencing previously. No real treatment to date. Interim medical history unchanged    Review of Systems     Objective:   Physical Exam Well-developed, well-nourished. No acute distress. Awake alert and oriented 3. Vital signs reviewed.  Right knee: Range of motion is 0 230. This is compared to 0 240 in the uninvolved left knee. No effusion. She is tender to palpation along the lateral joint line with a positive McMurray's and positive Thessaly's. No tenderness to palpation along the medial joint line. Good ligamentous stability. No Baker's cyst. Minimal patellofemoral crepitus. There is mild varus deformity with standing. Neurovascularly intact distally. Walking with a slight limp.  MSK ultrasound of the right knee was performed. Limited images were obtained. There is no obvious knee effusion. There is extrusion of the lateral meniscus with surrounding hypoechoic changes consistent with a small amount of fluid here. No discrete tear is seen.         Assessment & Plan:  Right knee pain likely secondary to degenerative lateral meniscal tear with ultrasound evidence of lateral meniscal extrusion  I discussed the  patient's treatment options including corticosteroid injection versus oral anti-inflammatories. She would like to try the oral medication first. I'll place her on Mobic 15 mg daily for 7 days then when necessary. She is cautioned about GI upset. We are going to try a body helix compression sleeve and I will have her do some straight leg raises and hamstring curls on a daily basis. She will follow-up with me in about 3 weeks. She understands that if symptoms persist or worsen then we should reconsider cortisone injection prior to further diagnostic imaging (x-rays from an outside source are not available for review but they were done through Dr.Pharr's office).

## 2014-03-13 ENCOUNTER — Ambulatory Visit: Payer: MEDICARE | Admitting: Sports Medicine

## 2014-03-25 ENCOUNTER — Encounter: Payer: Self-pay | Admitting: Sports Medicine

## 2014-03-25 ENCOUNTER — Ambulatory Visit (INDEPENDENT_AMBULATORY_CARE_PROVIDER_SITE_OTHER): Payer: MEDICARE | Admitting: Sports Medicine

## 2014-03-25 VITALS — BP 122/51 | Ht 64.0 in | Wt 145.0 lb

## 2014-03-25 DIAGNOSIS — M25561 Pain in right knee: Secondary | ICD-10-CM | POA: Diagnosis not present

## 2014-03-25 NOTE — Progress Notes (Signed)
Patient ID: Vicki Quinn, female   DOB: [birth year], 68 y.o.   MRN: [id number]  Subjective: Patient presents today for follow-up of her right knee pain. Since last visit her pain has improved considerably. She did take the Mobic for 7 days scheduled and has only used twice as needed since then. She is doing her knee exercises almost daily. When she does get pain is seems to be with prolonged walking and more towards the end of the day. Also any twisting of the knee remains painful. Her pain remains achy and located to the lateral knee. She continues to use the compression sleeve. She is doing some lower body exercises at the gym but has not started back walking and is interested in using the elliptical machine. She has no catching or locking of his knee. No significant swelling.   Objective: BP 122/51 mmHg  Ht 5\' 4"  (1.626 m)  Wt 145 lb (65.772 kg)  BMI 24.88 kg/m2 General: calm, cooperative, NAD HEENT: conj clear, sclera anicteric Respiratory: breathing non-labored Musckuloskeletal:  Right Knee  She is tender to palpation over the lateral joint line  There is mild soft tissue prominence in the anterior lateral knee  No joint effusion  Range of motion is normal  McMurray's is positive with a palpable click in the lateral knee  Lachman's negative  Varus and valgus stress normal without laxity  Mild patellofemoral crepitus  Assessment/Plan: Patient is a 68 year old female with 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  1.  Right Knee Pain 2.  Degenerative Tear, Lateral Meniscus  She appears to be improving well with home exercises and compression sleeve. Discussed expected course of this condition. She will use pain as her guide and continue home exercises. Advised her to start with recumbent bike then progressed to elliptical machine. Cautioned against returning to the treadmill. She can continue to use Mobic as needed. Unless pain worsens we'll try to avoid injection.  Patient  seen and discussed with Dr. Micheline Chapman.  Creig Hines PGY-3 Family Medicine

## 2014-04-02 DIAGNOSIS — H43393 Other vitreous opacities, bilateral: Secondary | ICD-10-CM | POA: Diagnosis not present

## 2014-04-02 DIAGNOSIS — H538 Other visual disturbances: Secondary | ICD-10-CM | POA: Diagnosis not present

## 2014-06-20 DIAGNOSIS — J019 Acute sinusitis, unspecified: Secondary | ICD-10-CM | POA: Diagnosis not present

## 2014-07-29 ENCOUNTER — Other Ambulatory Visit: Payer: Self-pay | Admitting: Gastroenterology

## 2014-07-29 DIAGNOSIS — Z1211 Encounter for screening for malignant neoplasm of colon: Secondary | ICD-10-CM | POA: Diagnosis not present

## 2014-07-29 DIAGNOSIS — K5730 Diverticulosis of large intestine without perforation or abscess without bleeding: Secondary | ICD-10-CM | POA: Diagnosis not present

## 2014-07-29 DIAGNOSIS — K635 Polyp of colon: Secondary | ICD-10-CM | POA: Diagnosis not present

## 2014-10-09 DIAGNOSIS — D239 Other benign neoplasm of skin, unspecified: Secondary | ICD-10-CM | POA: Diagnosis not present

## 2014-10-09 DIAGNOSIS — L821 Other seborrheic keratosis: Secondary | ICD-10-CM | POA: Diagnosis not present

## 2015-01-24 DIAGNOSIS — Z23 Encounter for immunization: Secondary | ICD-10-CM | POA: Diagnosis not present

## 2015-06-26 DIAGNOSIS — Z1231 Encounter for screening mammogram for malignant neoplasm of breast: Secondary | ICD-10-CM | POA: Diagnosis not present

## 2015-06-26 DIAGNOSIS — Z803 Family history of malignant neoplasm of breast: Secondary | ICD-10-CM | POA: Diagnosis not present

## 2015-07-03 ENCOUNTER — Encounter: Payer: MEDICARE | Admitting: Women's Health

## 2015-07-07 ENCOUNTER — Encounter: Payer: Self-pay | Admitting: Women's Health

## 2015-07-09 ENCOUNTER — Encounter: Payer: Self-pay | Admitting: Women's Health

## 2015-07-09 ENCOUNTER — Ambulatory Visit (INDEPENDENT_AMBULATORY_CARE_PROVIDER_SITE_OTHER): Payer: MEDICARE | Admitting: Women's Health

## 2015-07-09 VITALS — BP 126/84 | Ht 63.0 in | Wt 149.0 lb

## 2015-07-09 DIAGNOSIS — M8580 Other specified disorders of bone density and structure, unspecified site: Secondary | ICD-10-CM

## 2015-07-09 DIAGNOSIS — Z01419 Encounter for gynecological examination (general) (routine) without abnormal findings: Secondary | ICD-10-CM

## 2015-07-09 DIAGNOSIS — M899 Disorder of bone, unspecified: Secondary | ICD-10-CM | POA: Diagnosis not present

## 2015-07-09 DIAGNOSIS — B009 Herpesviral infection, unspecified: Secondary | ICD-10-CM

## 2015-07-09 DIAGNOSIS — Z13820 Encounter for screening for osteoporosis: Secondary | ICD-10-CM | POA: Diagnosis not present

## 2015-07-09 MED ORDER — VALACYCLOVIR HCL 500 MG PO TABS: ORAL_TABLET | ORAL | Status: DC

## 2015-07-09 NOTE — Patient Instructions (Signed)

## 2015-07-09 NOTE — Progress Notes (Signed)
Vicki Quinn 1945-11-30 [id number]    History:    Presents for breast and pelvic exam. TVH for fibroids normal Pap and mammogram history. 2014 T score -1.7 femoral neck FRAX 10%/1.4%. 2016 colonoscopy 2 benign colon polyps. Maternal grandmother died of breast cancer  in her 52s. Primary care manages hypercholesterolemia. HSV 1 rare outbreaks. Current on vaccines.  Past medical history, past surgical history, family history and social history were all reviewed and documented in the EPIC chart. Retired Scientist, research (medical). Mother died at age 54.  ROS:  A ROS was performed and pertinent positives and negatives are included.  Exam:  Filed Vitals:   07/09/15 1153  BP: 126/84    General appearance:  Normal Thyroid:  Symmetrical, normal in size, without palpable masses or nodularity. Respiratory  Auscultation:  Clear without wheezing or rhonchi Cardiovascular  Auscultation:  Regular rate, without rubs, murmurs or gallops  Edema/varicosities:  Not grossly evident Abdominal  Soft,nontender, without masses, guarding or rebound.  Liver/spleen:  No organomegaly noted  Hernia:  None appreciated  Skin  Inspection:  Grossly normal   Breasts: Examined lying and sitting.     Right: Without masses, retractions, discharge or axillary adenopathy.     Left: Without masses, retractions, discharge or axillary adenopathy. Gentitourinary   Inguinal/mons:  Normal without inguinal adenopathy  External genitalia:  Normal  BUS/Urethra/Skene's glands:  Normal  Vagina:  Atrophic  Cervix: And uterus absent  Adnexa/parametria:     Rt: Without masses or tenderness.   Lt: Without masses or tenderness.  Anus and perineum: Normal  Digital rectal exam: Normal sphincter tone without palpated masses or tenderness  Assessment/Plan:  70 y.o. M WF G0 for breast and pelvic exam with no complaints.  TVH for fibroids on no HRT Asymptomatic vaginal atrophy HSV-1 Hypercholesteremia-primary care manages labs and  meds Osteopenia without elevated FRAX  Plan: Valtrex 500 twice daily for 3-5 days when necessary prescription, proper use given and reviewed. SBE's, continue annual 3-D screening mammogram history of dense breasts. Continue to exercise, home safety and fall prevention discussed. Repeat DEXA. Vitamin D 2000 and over-the-counter daily encouraged. Vaginal lubricants with intercourse encouraged .    Huel Cote [facility], 12:39 PM 07/09/2015

## 2015-07-12 DIAGNOSIS — M8580 Other specified disorders of bone density and structure, unspecified site: Secondary | ICD-10-CM

## 2015-07-12 HISTORY — DX: Other specified disorders of bone density and structure, unspecified site: M85.80

## 2015-07-24 ENCOUNTER — Ambulatory Visit (INDEPENDENT_AMBULATORY_CARE_PROVIDER_SITE_OTHER): Payer: MEDICARE

## 2015-07-24 ENCOUNTER — Telehealth: Payer: Self-pay | Admitting: Gynecology

## 2015-07-24 ENCOUNTER — Other Ambulatory Visit: Payer: Self-pay | Admitting: Gynecology

## 2015-07-24 ENCOUNTER — Encounter: Payer: Self-pay | Admitting: Gynecology

## 2015-07-24 ENCOUNTER — Other Ambulatory Visit: Payer: Self-pay | Admitting: Women's Health

## 2015-07-24 DIAGNOSIS — M8580 Other specified disorders of bone density and structure, unspecified site: Secondary | ICD-10-CM

## 2015-07-24 DIAGNOSIS — M810 Age-related osteoporosis without current pathological fracture: Secondary | ICD-10-CM

## 2015-07-24 DIAGNOSIS — Z13820 Encounter for screening for osteoporosis: Secondary | ICD-10-CM

## 2015-07-24 DIAGNOSIS — M899 Disorder of bone, unspecified: Secondary | ICD-10-CM

## 2015-07-24 NOTE — Telephone Encounter (Signed)
Tell patient her bone density shows an increased fracture risk. Recommend office visit to discuss treatment options. Recommend also checking a vitamin D level now.

## 2015-07-31 NOTE — Telephone Encounter (Signed)
Left message for pt to call.

## 2015-07-31 NOTE — Telephone Encounter (Signed)
Pt informed with the below note, pt will have vitamin d level done at her PCP office.

## 2015-08-01 NOTE — Addendum Note (Signed)
Addended by: Thamas Jaegers on: 08/01/2015 12:09 PM   Modules accepted: Orders

## 2015-08-04 ENCOUNTER — Other Ambulatory Visit: Payer: MEDICARE

## 2015-08-04 DIAGNOSIS — M810 Age-related osteoporosis without current pathological fracture: Secondary | ICD-10-CM

## 2015-08-06 LAB — VITAMIN D 1,25 DIHYDROXY
Vitamin D 1, 25 (OH)2 Total: 52 pg/mL (ref 18–72)
Vitamin D2 1, 25 (OH)2: <8 pg/mL
Vitamin D3 1, 25 (OH)2: 52 pg/mL

## 2015-08-11 ENCOUNTER — Ambulatory Visit (INDEPENDENT_AMBULATORY_CARE_PROVIDER_SITE_OTHER): Payer: MEDICARE | Admitting: Gynecology

## 2015-08-11 ENCOUNTER — Encounter: Payer: Self-pay | Admitting: Gynecology

## 2015-08-11 VITALS — BP 120/70

## 2015-08-11 DIAGNOSIS — M8580 Other specified disorders of bone density and structure, unspecified site: Secondary | ICD-10-CM | POA: Diagnosis not present

## 2015-08-11 NOTE — Progress Notes (Signed)
    ANTHONY SCHAAD September 24, 1945 [id number]        70 y.o.  G0P0 presents to discuss her bone density.  T score -2.1 with FRAX 20%/4.7%. Stable from prior DEXA 2014. On questioning we have checked the steroid use yes in her FRAX calculation but by her history she transiently used steroids in her 30s for about 2 months.  Past medical history,surgical history, problem list, medications, allergies, family history and social history were all reviewed and documented in the EPIC chart.  Directed ROS with pertinent positives and negatives documented in the history of present illness/assessment and plan.  Exam: Filed Vitals:   08/11/15 1506  BP: 120/70   General appearance:  Normal   Assessment/Plan:  70 y.o. G0P0 with osteopenia stable from prior DEXA. I think her FRAX calculation is off and that we are going to recalculate this with no in the glucocorticoid box and then see what her 10 year fracture risk is calculated. New FRAX calculation is 11%/2.4%.Marland KitchenHer recent vitamin D level was 52. She does exercise with walking on a regular basis. At this point we both feel comfortable with no treatment but observation and repeat of her bone density in 2 years.  Greater than 50% of my 15 minute office visit was spent in direct face to face counseling and coordination of care with the patient.   Anastasio Auerbach MD, 3:56 PM 08/11/2015

## 2015-08-11 NOTE — Patient Instructions (Signed)
Follow up for your annual exam in one year.  We will plan on repeating your bone density in 2 years.

## 2015-08-12 ENCOUNTER — Telehealth: Payer: Self-pay | Admitting: *Deleted

## 2015-08-12 NOTE — Telephone Encounter (Signed)
-----   Message from Anastasio Auerbach, MD sent at 08/11/2015  4:18 PM EDT ----- Tell patient the new calculation for her fracture risk estimation is 11%/2.4% much better than the prior calculation. Recommend repeating her bone density in 2 years.

## 2015-08-12 NOTE — Telephone Encounter (Signed)
Left a detailed message per DPR access on pt home #

## 2015-11-18 DIAGNOSIS — D239 Other benign neoplasm of skin, unspecified: Secondary | ICD-10-CM | POA: Diagnosis not present

## 2015-11-26 DIAGNOSIS — Z83518 Family history of other specified eye disorder: Secondary | ICD-10-CM | POA: Diagnosis not present

## 2015-11-26 DIAGNOSIS — H353131 Nonexudative age-related macular degeneration, bilateral, early dry stage: Secondary | ICD-10-CM | POA: Diagnosis not present

## 2015-11-26 DIAGNOSIS — H43813 Vitreous degeneration, bilateral: Secondary | ICD-10-CM | POA: Diagnosis not present

## 2015-11-26 DIAGNOSIS — H40033 Anatomical narrow angle, bilateral: Secondary | ICD-10-CM | POA: Diagnosis not present

## 2015-11-26 DIAGNOSIS — B009 Herpesviral infection, unspecified: Secondary | ICD-10-CM | POA: Diagnosis not present

## 2015-11-26 DIAGNOSIS — H25013 Cortical age-related cataract, bilateral: Secondary | ICD-10-CM | POA: Diagnosis not present

## 2015-11-26 DIAGNOSIS — H52223 Regular astigmatism, bilateral: Secondary | ICD-10-CM | POA: Diagnosis not present

## 2015-11-26 DIAGNOSIS — H2513 Age-related nuclear cataract, bilateral: Secondary | ICD-10-CM | POA: Diagnosis not present

## 2015-11-26 DIAGNOSIS — Z809 Family history of malignant neoplasm, unspecified: Secondary | ICD-10-CM | POA: Diagnosis not present

## 2015-11-26 DIAGNOSIS — H468 Other optic neuritis: Secondary | ICD-10-CM | POA: Diagnosis not present

## 2015-11-26 DIAGNOSIS — H47292 Other optic atrophy, left eye: Secondary | ICD-10-CM | POA: Diagnosis not present

## 2015-12-26 DIAGNOSIS — H2512 Age-related nuclear cataract, left eye: Secondary | ICD-10-CM | POA: Diagnosis not present

## 2016-01-07 DIAGNOSIS — H25812 Combined forms of age-related cataract, left eye: Secondary | ICD-10-CM | POA: Diagnosis not present

## 2016-01-07 DIAGNOSIS — H25012 Cortical age-related cataract, left eye: Secondary | ICD-10-CM | POA: Diagnosis not present

## 2016-01-07 DIAGNOSIS — H25811 Combined forms of age-related cataract, right eye: Secondary | ICD-10-CM | POA: Diagnosis not present

## 2016-01-07 DIAGNOSIS — H2512 Age-related nuclear cataract, left eye: Secondary | ICD-10-CM | POA: Diagnosis not present

## 2016-01-16 DIAGNOSIS — H25011 Cortical age-related cataract, right eye: Secondary | ICD-10-CM | POA: Diagnosis not present

## 2016-01-16 DIAGNOSIS — H2511 Age-related nuclear cataract, right eye: Secondary | ICD-10-CM | POA: Diagnosis not present

## 2016-01-23 DIAGNOSIS — Z23 Encounter for immunization: Secondary | ICD-10-CM | POA: Diagnosis not present

## 2016-02-04 DIAGNOSIS — H268 Other specified cataract: Secondary | ICD-10-CM | POA: Diagnosis not present

## 2016-02-04 DIAGNOSIS — H25011 Cortical age-related cataract, right eye: Secondary | ICD-10-CM | POA: Diagnosis not present

## 2016-02-04 DIAGNOSIS — H25811 Combined forms of age-related cataract, right eye: Secondary | ICD-10-CM | POA: Diagnosis not present

## 2016-02-04 DIAGNOSIS — H2511 Age-related nuclear cataract, right eye: Secondary | ICD-10-CM | POA: Diagnosis not present

## 2016-06-28 ENCOUNTER — Encounter: Payer: Self-pay | Admitting: Women's Health

## 2016-06-28 DIAGNOSIS — Z1231 Encounter for screening mammogram for malignant neoplasm of breast: Secondary | ICD-10-CM | POA: Diagnosis not present

## 2016-07-12 ENCOUNTER — Encounter: Payer: Self-pay | Admitting: Women's Health

## 2016-07-12 ENCOUNTER — Ambulatory Visit (INDEPENDENT_AMBULATORY_CARE_PROVIDER_SITE_OTHER): Payer: MEDICARE | Admitting: Women's Health

## 2016-07-12 VITALS — BP 116/74 | Ht 63.0 in | Wt 149.4 lb

## 2016-07-12 DIAGNOSIS — M8580 Other specified disorders of bone density and structure, unspecified site: Secondary | ICD-10-CM | POA: Diagnosis not present

## 2016-07-12 DIAGNOSIS — N898 Other specified noninflammatory disorders of vagina: Secondary | ICD-10-CM

## 2016-07-12 DIAGNOSIS — L298 Other pruritus: Secondary | ICD-10-CM

## 2016-07-12 DIAGNOSIS — N952 Postmenopausal atrophic vaginitis: Secondary | ICD-10-CM

## 2016-07-12 DIAGNOSIS — B009 Herpesviral infection, unspecified: Secondary | ICD-10-CM

## 2016-07-12 LAB — WET PREP FOR TRICH, YEAST, CLUE
Clue Cells Wet Prep HPF POC: NONE SEEN
Trich, Wet Prep: NONE SEEN
Yeast Wet Prep HPF POC: NONE SEEN

## 2016-07-12 MED ORDER — VALACYCLOVIR HCL 500 MG PO TABS: ORAL_TABLET | ORAL | 1 refills | Status: DC

## 2016-07-12 NOTE — Addendum Note (Signed)
Addended by: Thurnell Garbe A on: 07/12/2016 02:01 PM   Modules accepted: Orders

## 2016-07-12 NOTE — Progress Notes (Signed)
Vicki Quinn November 07, 1945 [id number]    History:    Presents for breast and pelvic exam. TVH for fibroids on no HRT.  Normal Pap and mammogram history.  2017 T score -2.1 femoral neck, initially thought to have an elevated FRAX but with recalculation after corrections made normal risk. 2016 2 benign colon polyps. Elevated cholesterol primary care manages. HSV 1 outbreaks. [facility]  Past medical history, past surgical history, family history and social history were all reviewed and documented in the EPIC chart.  ROS:  A ROS was performed and pertinent positives and negatives are included.  Exam:  Vitals:   07/12/16 1133  BP: 116/74  Weight: 149 lb 6.4 oz (67.8 kg)  Height: 5\' 3"  (1.6 m)   Body mass index is 26.47 kg/m.   General appearance:  Normal Thyroid:  Symmetrical, normal in size, without palpable masses or nodularity. Respiratory  Auscultation:  Clear without wheezing or rhonchi Cardiovascular  Auscultation:  Regular rate, without rubs, murmurs or gallops  Edema/varicosities:  Not grossly evident Abdominal  Soft,nontender, without masses, guarding or rebound.  Liver/spleen:  No organomegaly noted  Hernia:  None appreciated  Skin  Inspection:  Grossly normal   Breasts: Examined lying and sitting.     Right: Without masses, retractions, discharge or axillary adenopathy.     Left: Without masses, retractions, discharge or axillary adenopathy. Gentitourinary   Inguinal/mons:  Normal without inguinal adenopathy  External genitalia:  Normal  BUS/Urethra/Skene's glands:  Normal  Vagina:  Atrophic  Cervix:  And uterus absent  Adnexa/parametria:     Rt: Without masses or tenderness.   Lt: Without masses or tenderness.  Anus and perineum: Normal  Digital rectal exam: Normal sphincter tone without palpated masses or tenderness  Assessment/Plan:  71 y.o. MWF G0 for breast and pelvic exam with no complaints other than occasional intermittent mild  vaginal itching.  TVH on no HRT Vaginal atrophy with mild itching Anxiety/depression/stable primary care manages labs and meds, current on vaccines HSV-1 rare outbreaks Osteopenia without elevated FRAX  Plan: Continue over-the-counter vaginal lubricants. SBE's, continue annual  screening mammogram, calcium rich diet, continue same vitamin D supplement encouraged. Continue annual skin screening with dermatologist. Home safety, fall prevention and importance of weightbearing exercise reviewed. Valtrex 500 mg twice daily for 3-5 days as needed.    Huel Cote [facility], 12:25 PM 07/12/2016

## 2016-07-12 NOTE — Patient Instructions (Signed)
Health Maintenance for Postmenopausal Women Menopause is a normal process in which your reproductive ability comes to an end. This process happens gradually over a span of months to years, usually between the ages of 33 and 38. Menopause is complete when you have missed 12 consecutive menstrual periods. It is important to talk with your health care provider about some of the most common conditions that affect postmenopausal women, such as heart disease, cancer, and bone loss (osteoporosis). Adopting a healthy lifestyle and getting preventive care can help to promote your health and wellness. Those actions can also lower your chances of developing some of these common conditions. What should I know about menopause? During menopause, you may experience a number of symptoms, such as:  Moderate-to-severe hot flashes.  Night sweats.  Decrease in sex drive.  Mood swings.  Headaches.  Tiredness.  Irritability.  Memory problems.  Insomnia. Choosing to treat or not to treat menopausal changes is an individual decision that you make with your health care provider. What should I know about hormone replacement therapy and supplements? Hormone therapy products are effective for treating symptoms that are associated with menopause, such as hot flashes and night sweats. Hormone replacement carries certain risks, especially as you become older. If you are thinking about using estrogen or estrogen with progestin treatments, discuss the benefits and risks with your health care provider. What should I know about heart disease and stroke? Heart disease, heart attack, and stroke become more likely as you age. This may be due, in part, to the hormonal changes that your body experiences during menopause. These can affect how your body processes dietary fats, triglycerides, and cholesterol. Heart attack and stroke are both medical emergencies. There are many things that you can do to help prevent heart disease  and stroke:  Have your blood pressure checked at least every 1-2 years. High blood pressure causes heart disease and increases the risk of stroke.  If you are 48-61 years old, ask your health care provider if you should take aspirin to prevent a heart attack or a stroke.  Do not use any tobacco products, including cigarettes, chewing tobacco, or electronic cigarettes. If you need help quitting, ask your health care provider.  It is important to eat a healthy diet and maintain a healthy weight.  Be sure to include plenty of vegetables, fruits, low-fat dairy products, and lean protein.  Avoid eating foods that are high in solid fats, added sugars, or salt (sodium).  Get regular exercise. This is one of the most important things that you can do for your health.  Try to exercise for at least 150 minutes each week. The type of exercise that you do should increase your heart rate and make you sweat. This is known as moderate-intensity exercise.  Try to do strengthening exercises at least twice each week. Do these in addition to the moderate-intensity exercise.  Know your numbers.Ask your health care provider to check your cholesterol and your blood glucose. Continue to have your blood tested as directed by your health care provider. What should I know about cancer screening? There are several types of cancer. Take the following steps to reduce your risk and to catch any cancer development as early as possible. Breast Cancer  Practice breast self-awareness.  This means understanding how your breasts normally appear and feel.  It also means doing regular breast self-exams. Let your health care provider know about any changes, no matter how small.  If you are 40 or older,  have a clinician do a breast exam (clinical breast exam or CBE) every year. Depending on your age, family history, and medical history, it may be recommended that you also have a yearly breast X-ray (mammogram).  If you  have a family history of breast cancer, talk with your health care provider about genetic screening.  If you are at high risk for breast cancer, talk with your health care provider about having an MRI and a mammogram every year.  Breast cancer (BRCA) gene test is recommended for women who have family members with BRCA-related cancers. Results of the assessment will determine the need for genetic counseling and BRCA1 and for BRCA2 testing. BRCA-related cancers include these types:  Breast. This occurs in males or females.  Ovarian.  Tubal. This may also be called fallopian tube cancer.  Cancer of the abdominal or pelvic lining (peritoneal cancer).  Prostate.  Pancreatic. Cervical, Uterine, and Ovarian Cancer  Your health care provider may recommend that you be screened regularly for cancer of the pelvic organs. These include your ovaries, uterus, and vagina. This screening involves a pelvic exam, which includes checking for microscopic changes to the surface of your cervix (Pap test).  For women ages 21-65, health care providers may recommend a pelvic exam and a Pap test every three years. For women ages 23-65, they may recommend the Pap test and pelvic exam, combined with testing for human papilloma virus (HPV), every five years. Some types of HPV increase your risk of cervical cancer. Testing for HPV may also be done on women of any age who have unclear Pap test results.  Other health care providers may not recommend any screening for nonpregnant women who are considered low risk for pelvic cancer and have no symptoms. Ask your health care provider if a screening pelvic exam is right for you.  If you have had past treatment for cervical cancer or a condition that could lead to cancer, you need Pap tests and screening for cancer for at least 20 years after your treatment. If Pap tests have been discontinued for you, your risk factors (such as having a new sexual partner) need to be reassessed  to determine if you should start having screenings again. Some women have medical problems that increase the chance of getting cervical cancer. In these cases, your health care provider may recommend that you have screening and Pap tests more often.  If you have a family history of uterine cancer or ovarian cancer, talk with your health care provider about genetic screening.  If you have vaginal bleeding after reaching menopause, tell your health care provider.  There are currently no reliable tests available to screen for ovarian cancer. Lung Cancer  Lung cancer screening is recommended for adults 99-83 years old who are at high risk for lung cancer because of a history of smoking. A yearly low-dose CT scan of the lungs is recommended if you:  Currently smoke.  Have a history of at least 30 pack-years of smoking and you currently smoke or have quit within the past 15 years. A pack-year is smoking an average of one pack of cigarettes per day for one year. Yearly screening should:  Continue until it has been 15 years since you quit.  Stop if you develop a health problem that would prevent you from having lung cancer treatment. Colorectal Cancer  This type of cancer can be detected and can often be prevented.  Routine colorectal cancer screening usually begins at age 72 and continues  through age 75.  If you have risk factors for colon cancer, your health care provider may recommend that you be screened at an earlier age.  If you have a family history of colorectal cancer, talk with your health care provider about genetic screening.  Your health care provider may also recommend using home test kits to check for hidden blood in your stool.  A small camera at the end of a tube can be used to examine your colon directly (sigmoidoscopy or colonoscopy). This is done to check for the earliest forms of colorectal cancer.  Direct examination of the colon should be repeated every 5-10 years until  age 75. However, if early forms of precancerous polyps or small growths are found or if you have a family history or genetic risk for colorectal cancer, you may need to be screened more often. Skin Cancer  Check your skin from head to toe regularly.  Monitor any moles. Be sure to tell your health care provider:  About any new moles or changes in moles, especially if there is a change in a mole's shape or color.  If you have a mole that is larger than the size of a pencil eraser.  If any of your family members has a history of skin cancer, especially at a young age, talk with your health care provider about genetic screening.  Always use sunscreen. Apply sunscreen liberally and repeatedly throughout the day.  Whenever you are outside, protect yourself by wearing long sleeves, pants, a wide-brimmed hat, and sunglasses. What should I know about osteoporosis? Osteoporosis is a condition in which bone destruction happens more quickly than new bone creation. After menopause, you may be at an increased risk for osteoporosis. To help prevent osteoporosis or the bone fractures that can happen because of osteoporosis, the following is recommended:  If you are 19-50 years old, get at least 1,000 mg of calcium and at least 600 mg of vitamin D per day.  If you are older than age 50 but younger than age 70, get at least 1,200 mg of calcium and at least 600 mg of vitamin D per day.  If you are older than age 70, get at least 1,200 mg of calcium and at least 800 mg of vitamin D per day. Smoking and excessive alcohol intake increase the risk of osteoporosis. Eat foods that are rich in calcium and vitamin D, and do weight-bearing exercises several times each week as directed by your health care provider. What should I know about how menopause affects my mental health? Depression may occur at any age, but it is more common as you become older. Common symptoms of depression include:  Low or sad  mood.  Changes in sleep patterns.  Changes in appetite or eating patterns.  Feeling an overall lack of motivation or enjoyment of activities that you previously enjoyed.  Frequent crying spells. Talk with your health care provider if you think that you are experiencing depression. What should I know about immunizations? It is important that you get and maintain your immunizations. These include:  Tetanus, diphtheria, and pertussis (Tdap) booster vaccine.  Influenza every year before the flu season begins.  Pneumonia vaccine.  Shingles vaccine. Your health care provider may also recommend other immunizations. This information is not intended to replace advice given to you by your health care provider. Make sure you discuss any questions you have with your health care provider. Document Released: 05/21/2005 Document Revised: 10/17/2015 Document Reviewed: 12/31/2014 [employer] Interactive Patient   Education  2017 [employer]  

## 2016-07-15 DIAGNOSIS — K219 Gastro-esophageal reflux disease without esophagitis: Secondary | ICD-10-CM | POA: Diagnosis not present

## 2016-07-15 DIAGNOSIS — M2550 Pain in unspecified joint: Secondary | ICD-10-CM | POA: Diagnosis not present

## 2016-07-15 DIAGNOSIS — R0789 Other chest pain: Secondary | ICD-10-CM | POA: Diagnosis not present

## 2016-07-29 DIAGNOSIS — M2550 Pain in unspecified joint: Secondary | ICD-10-CM | POA: Diagnosis not present

## 2016-07-29 DIAGNOSIS — K219 Gastro-esophageal reflux disease without esophagitis: Secondary | ICD-10-CM | POA: Diagnosis not present

## 2016-08-16 DIAGNOSIS — N390 Urinary tract infection, site not specified: Secondary | ICD-10-CM | POA: Diagnosis not present

## 2016-08-16 DIAGNOSIS — E7800 Pure hypercholesterolemia, unspecified: Secondary | ICD-10-CM | POA: Diagnosis not present

## 2016-08-16 DIAGNOSIS — Z0000 Encounter for general adult medical examination without abnormal findings: Secondary | ICD-10-CM | POA: Diagnosis not present

## 2016-08-16 DIAGNOSIS — E559 Vitamin D deficiency, unspecified: Secondary | ICD-10-CM | POA: Diagnosis not present

## 2016-08-23 DIAGNOSIS — R110 Nausea: Secondary | ICD-10-CM | POA: Diagnosis not present

## 2016-08-23 DIAGNOSIS — L80 Vitiligo: Secondary | ICD-10-CM | POA: Diagnosis not present

## 2016-08-23 DIAGNOSIS — Z880 Allergy status to penicillin: Secondary | ICD-10-CM | POA: Diagnosis not present

## 2016-08-23 DIAGNOSIS — Z882 Allergy status to sulfonamides status: Secondary | ICD-10-CM | POA: Diagnosis not present

## 2016-08-25 ENCOUNTER — Encounter: Payer: Self-pay | Admitting: Gynecology

## 2016-10-14 DIAGNOSIS — K219 Gastro-esophageal reflux disease without esophagitis: Secondary | ICD-10-CM | POA: Diagnosis not present

## 2016-12-01 DIAGNOSIS — H353131 Nonexudative age-related macular degeneration, bilateral, early dry stage: Secondary | ICD-10-CM | POA: Diagnosis not present

## 2016-12-01 DIAGNOSIS — H11823 Conjunctivochalasis, bilateral: Secondary | ICD-10-CM | POA: Diagnosis not present

## 2016-12-01 DIAGNOSIS — Z961 Presence of intraocular lens: Secondary | ICD-10-CM | POA: Diagnosis not present

## 2016-12-01 DIAGNOSIS — H47292 Other optic atrophy, left eye: Secondary | ICD-10-CM | POA: Diagnosis not present

## 2016-12-01 DIAGNOSIS — H43813 Vitreous degeneration, bilateral: Secondary | ICD-10-CM | POA: Diagnosis not present

## 2017-01-21 DIAGNOSIS — Z23 Encounter for immunization: Secondary | ICD-10-CM | POA: Diagnosis not present

## 2017-08-22 ENCOUNTER — Ambulatory Visit (INDEPENDENT_AMBULATORY_CARE_PROVIDER_SITE_OTHER): Payer: MEDICARE | Admitting: Sports Medicine

## 2017-08-22 ENCOUNTER — Ambulatory Visit
Admission: RE | Admit: 2017-08-22 | Discharge: 2017-08-22 | Disposition: A | Discharge status: Home / Self Care | Payer: Medicare Other | Source: Ambulatory Visit | Attending: Sports Medicine | Admitting: Sports Medicine

## 2017-08-22 ENCOUNTER — Encounter: Payer: Self-pay | Admitting: Sports Medicine

## 2017-08-22 VITALS — BP 124/64 | Ht 64.0 in | Wt 148.0 lb

## 2017-08-22 DIAGNOSIS — M1711 Unilateral primary osteoarthritis, right knee: Secondary | ICD-10-CM | POA: Diagnosis not present

## 2017-08-22 DIAGNOSIS — G8929 Other chronic pain: Secondary | ICD-10-CM | POA: Diagnosis not present

## 2017-08-22 DIAGNOSIS — M25561 Pain in right knee: Secondary | ICD-10-CM

## 2017-08-22 NOTE — Progress Notes (Addendum)
   HPI  CC: Right Knee Pain  Right Knee Pain - Patient was previously seen and examined in the office for right lateral knee pain in 2015 which was thought to be a degenerative tear. She was prescribed sleeve and hamstring strengthening exercises at that time. She has continued to do the exercises. Over the past two months, she has noticed intermittent posterior right knee pain, worse with weight bearing but without specific pattern to the pain. She has not noticed swelling. She feels she may notice some catching of the knee when she gets out of the car or goes from sitting to standing.  Past Injuries: Denies  Past Surgeries: None on right knee   CC, FH, Smoking hx reviewed  ROS: Per HPI; in addition no fever, no rash, no additional weakness, no additional numbness, no additional paresthesias, and no additional falls/injury.   Objective: BP 124/64   Ht 5\' 4"  (1.626 m)   Wt 148 lb (67.1 kg)   BMI 25.40 kg/m  Gen: NAD, well groomed, a/o x3, normal affect.  CV: Well-perfused. Warm.  Resp: Non-labored.  Neuro: Sensation intact throughout. No gross coordination deficits.  Knee: Normal to inspection with no erythema or effusion or obvious bony abnormalities. Palpation normal with no warmth, joint line tenderness, patellar tenderness, or condyle tenderness. No tenderness over the back of the knee. ROM full in flexion and extension and lower leg rotation. Ligaments with solid consistent endpoints including ACL, PCL, LCL, MCL. Negative Mcmurray's, and Thessalonian tests. Non painful patellar compression. Patellar glide without crepitus. Patellar and quadriceps tendons unremarkable. Hamstring and quadriceps strength is normal.   Bedside ultrasound - no evidence of baker's cyst on posterior knee exam.  Assessment and Plan:  Right knee pain - acute on chronic. 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 Osteoarthritic pain. No baker's cyst visualized on bedside ultrasound in the office today.  Patient first presented with pain in this knee in 2015. She has had plain films by PCP several years ago which are not available for viewing in our EMR. Patient reports previous XR was negative for osteoarthritis.  - continue daily hamstring and quad strengthening exercises - check plain films of Right knee - can consider MRI knee if plain films do not show extensive osteoarthritis - refitted for sleeve to use with exercise and activity - will call with results of XR  Orders Placed This Encounter  Procedures  . DG Knee AP/LAT W/Sunrise Right    Standing Status:   Future    Standing Expiration Date:   10/23/2018    Order Specific Question:   Reason for Exam (SYMPTOM  OR DIAGNOSIS REQUIRED)    Answer:   right knee pain; standing AP, lateral and sunrise views    Order Specific Question:   Preferred imaging location?    Answer:   [facility]    Order Specific Question:   Radiology Contrast Protocol - do NOT remove file path    Answer:   [email or web address]   Everrett Coombe, MD PGY-2 [facility]  Patient seen and evaluated with the resident. I agree with the above plan of care. I will call the patient with her x-ray results once available. We may consider further diagnostic imaging if x-rays are unremarkable.  Addendum: X-rays reviewed. Minimal arthritic changes seen. Proceed with MRI to rule out meniscal tear

## 2017-08-23 ENCOUNTER — Other Ambulatory Visit: Payer: Self-pay | Admitting: *Deleted

## 2017-08-23 DIAGNOSIS — M25561 Pain in right knee: Secondary | ICD-10-CM

## 2017-08-27 ENCOUNTER — Ambulatory Visit
Admission: RE | Admit: 2017-08-27 | Discharge: 2017-08-27 | Disposition: A | Discharge status: Home / Self Care | Payer: Medicare Other | Source: Ambulatory Visit | Attending: Sports Medicine | Admitting: Sports Medicine

## 2017-08-27 DIAGNOSIS — M23351 Other meniscus derangements, posterior horn of lateral meniscus, right knee: Secondary | ICD-10-CM | POA: Diagnosis not present

## 2017-08-27 DIAGNOSIS — M25561 Pain in right knee: Secondary | ICD-10-CM

## 2017-08-31 ENCOUNTER — Telehealth: Payer: Self-pay | Admitting: Sports Medicine

## 2017-08-31 NOTE — Telephone Encounter (Signed)
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 A small 8 mm loose body is also seen. Patient denies any mechanical symptoms in her knee. She has some days where she has no pain at all and other days where it is a little more bothersome. She especially notices pain with going up and down stairs but that is tolerable to her at this point in time. I had a long discussion regarding her diagnosis and treatment going forward. Her meniscal tear is likely degenerative and since she is not getting any mechanical symptoms I think we should continue with conservative treatment. She has been doing her home exercises and riding her bike and feels like her knee is getting better. She also takes an occasional Tylenol or Aleve and finds that to be beneficial also. I recommended that she continue with her current treatment for now. I did discuss the possibility of a cortisone injection if her symptoms become more bothersome. Patient is in agreement with this plan. She will follow-up with me as needed.

## 2017-11-10 DIAGNOSIS — F429 Obsessive-compulsive disorder, unspecified: Secondary | ICD-10-CM | POA: Diagnosis not present

## 2017-11-14 ENCOUNTER — Encounter: Payer: Self-pay | Admitting: Women's Health

## 2017-11-14 DIAGNOSIS — Z1231 Encounter for screening mammogram for malignant neoplasm of breast: Secondary | ICD-10-CM | POA: Diagnosis not present

## 2017-11-16 DIAGNOSIS — Z23 Encounter for immunization: Secondary | ICD-10-CM | POA: Diagnosis not present

## 2017-11-25 DIAGNOSIS — D229 Melanocytic nevi, unspecified: Secondary | ICD-10-CM | POA: Diagnosis not present

## 2017-11-25 DIAGNOSIS — L80 Vitiligo: Secondary | ICD-10-CM | POA: Diagnosis not present

## 2017-11-25 DIAGNOSIS — L821 Other seborrheic keratosis: Secondary | ICD-10-CM | POA: Diagnosis not present

## 2017-12-06 ENCOUNTER — Encounter: Payer: Self-pay | Admitting: Women's Health

## 2017-12-06 ENCOUNTER — Ambulatory Visit (INDEPENDENT_AMBULATORY_CARE_PROVIDER_SITE_OTHER): Payer: MEDICARE | Admitting: Women's Health

## 2017-12-06 VITALS — BP 118/80 | Ht 64.0 in | Wt 152.0 lb

## 2017-12-06 DIAGNOSIS — Z9189 Other specified personal risk factors, not elsewhere classified: Secondary | ICD-10-CM

## 2017-12-06 DIAGNOSIS — E2839 Other primary ovarian failure: Secondary | ICD-10-CM | POA: Diagnosis not present

## 2017-12-06 DIAGNOSIS — Z01411 Encounter for gynecological examination (general) (routine) with abnormal findings: Secondary | ICD-10-CM | POA: Diagnosis not present

## 2017-12-06 DIAGNOSIS — N898 Other specified noninflammatory disorders of vagina: Secondary | ICD-10-CM

## 2017-12-06 DIAGNOSIS — Z13820 Encounter for screening for osteoporosis: Secondary | ICD-10-CM | POA: Diagnosis not present

## 2017-12-06 LAB — WET PREP FOR TRICH, YEAST, CLUE

## 2017-12-06 MED ORDER — NYSTATIN-TRIAMCINOLONE 100000-0.1 UNIT/GM-% EX OINT: 1.0000 "application " | TOPICAL_OINTMENT | Freq: Two times a day (BID) | CUTANEOUS | 1 refills | Status: DC

## 2017-12-06 NOTE — Patient Instructions (Signed)
Health Maintenance for Postmenopausal Women Menopause is a normal process in which your reproductive ability comes to an end. This process happens gradually over a span of months to years, usually between the ages of 22 and 9. Menopause is complete when you have missed 12 consecutive menstrual periods. It is important to talk with your health care provider about some of the most common conditions that affect postmenopausal women, such as heart disease, cancer, and bone loss (osteoporosis). Adopting a healthy lifestyle and getting preventive care can help to promote your health and wellness. Those actions can also lower your chances of developing some of these common conditions. What should I know about menopause? During menopause, you may experience a number of symptoms, such as:  Moderate-to-severe hot flashes.  Night sweats.  Decrease in sex drive.  Mood swings.  Headaches.  Tiredness.  Irritability.  Memory problems.  Insomnia.  Choosing to treat or not to treat menopausal changes is an individual decision that you make with your health care provider. What should I know about hormone replacement therapy and supplements? Hormone therapy products are effective for treating symptoms that are associated with menopause, such as hot flashes and night sweats. Hormone replacement carries certain risks, especially as you become older. If you are thinking about using estrogen or estrogen with progestin treatments, discuss the benefits and risks with your health care provider. What should I know about heart disease and stroke? Heart disease, heart attack, and stroke become more likely as you age. This may be due, in part, to the hormonal changes that your body experiences during menopause. These can affect how your body processes dietary fats, triglycerides, and cholesterol. Heart attack and stroke are both medical emergencies. There are many things that you can do to help prevent heart disease  and stroke:  Have your blood pressure checked at least every 1-2 years. High blood pressure causes heart disease and increases the risk of stroke.  If you are 53-22 years old, ask your health care provider if you should take aspirin to prevent a heart attack or a stroke.  Do not use any tobacco products, including cigarettes, chewing tobacco, or electronic cigarettes. If you need help quitting, ask your health care provider.  It is important to eat a healthy diet and maintain a healthy weight. ? Be sure to include plenty of vegetables, fruits, low-fat dairy products, and lean protein. ? Avoid eating foods that are high in solid fats, added sugars, or salt (sodium).  Get regular exercise. This is one of the most important things that you can do for your health. ? Try to exercise for at least 150 minutes each week. The type of exercise that you do should increase your heart rate and make you sweat. This is known as moderate-intensity exercise. ? Try to do strengthening exercises at least twice each week. Do these in addition to the moderate-intensity exercise.  Know your numbers.Ask your health care provider to check your cholesterol and your blood glucose. Continue to have your blood tested as directed by your health care provider.  What should I know about cancer screening? There are several types of cancer. Take the following steps to reduce your risk and to catch any cancer development as early as possible. Breast Cancer  Practice breast self-awareness. ? This means understanding how your breasts normally appear and feel. ? It also means doing regular breast self-exams. Let your health care provider know about any changes, no matter how small.  If you are 40  or older, have a clinician do a breast exam (clinical breast exam or CBE) every year. Depending on your age, family history, and medical history, it may be recommended that you also have a yearly breast X-ray (mammogram).  If you  have a family history of breast cancer, talk with your health care provider about genetic screening.  If you are at high risk for breast cancer, talk with your health care provider about having an MRI and a mammogram every year.  Breast cancer (BRCA) gene test is recommended for women who have family members with BRCA-related cancers. Results of the assessment will determine the need for genetic counseling and BRCA1 and for BRCA2 testing. BRCA-related cancers include these types: ? Breast. This occurs in males or females. ? Ovarian. ? Tubal. This may also be called fallopian tube cancer. ? Cancer of the abdominal or pelvic lining (peritoneal cancer). ? Prostate. ? Pancreatic.  Cervical, Uterine, and Ovarian Cancer Your health care provider may recommend that you be screened regularly for cancer of the pelvic organs. These include your ovaries, uterus, and vagina. This screening involves a pelvic exam, which includes checking for microscopic changes to the surface of your cervix (Pap test).  For women ages 21-65, health care providers may recommend a pelvic exam and a Pap test every three years. For women ages 79-65, they may recommend the Pap test and pelvic exam, combined with testing for human papilloma virus (HPV), every five years. Some types of HPV increase your risk of cervical cancer. Testing for HPV may also be done on women of any age who have unclear Pap test results.  Other health care providers may not recommend any screening for nonpregnant women who are considered low risk for pelvic cancer and have no symptoms. Ask your health care provider if a screening pelvic exam is right for you.  If you have had past treatment for cervical cancer or a condition that could lead to cancer, you need Pap tests and screening for cancer for at least 20 years after your treatment. If Pap tests have been discontinued for you, your risk factors (such as having a new sexual partner) need to be  reassessed to determine if you should start having screenings again. Some women have medical problems that increase the chance of getting cervical cancer. In these cases, your health care provider may recommend that you have screening and Pap tests more often.  If you have a family history of uterine cancer or ovarian cancer, talk with your health care provider about genetic screening.  If you have vaginal bleeding after reaching menopause, tell your health care provider.  There are currently no reliable tests available to screen for ovarian cancer.  Lung Cancer Lung cancer screening is recommended for adults 69-62 years old who are at high risk for lung cancer because of a history of smoking. A yearly low-dose CT scan of the lungs is recommended if you:  Currently smoke.  Have a history of at least 30 pack-years of smoking and you currently smoke or have quit within the past 15 years. A pack-year is smoking an average of one pack of cigarettes per day for one year.  Yearly screening should:  Continue until it has been 15 years since you quit.  Stop if you develop a health problem that would prevent you from having lung cancer treatment.  Colorectal Cancer  This type of cancer can be detected and can often be prevented.  Routine colorectal cancer screening usually begins at  age 42 and continues through age 45.  If you have risk factors for colon cancer, your health care provider may recommend that you be screened at an earlier age.  If you have a family history of colorectal cancer, talk with your health care provider about genetic screening.  Your health care provider may also recommend using home test kits to check for hidden blood in your stool.  A small camera at the end of a tube can be used to examine your colon directly (sigmoidoscopy or colonoscopy). This is done to check for the earliest forms of colorectal cancer.  Direct examination of the colon should be repeated every  5-10 years until age 71. However, if early forms of precancerous polyps or small growths are found or if you have a family history or genetic risk for colorectal cancer, you may need to be screened more often.  Skin Cancer  Check your skin from head to toe regularly.  Monitor any moles. Be sure to tell your health care provider: ? About any new moles or changes in moles, especially if there is a change in a mole's shape or color. ? If you have a mole that is larger than the size of a pencil eraser.  If any of your family members has a history of skin cancer, especially at a young age, talk with your health care provider about genetic screening.  Always use sunscreen. Apply sunscreen liberally and repeatedly throughout the day.  Whenever you are outside, protect yourself by wearing long sleeves, pants, a wide-brimmed hat, and sunglasses.  What should I know about osteoporosis? Osteoporosis is a condition in which bone destruction happens more quickly than new bone creation. After menopause, you may be at an increased risk for osteoporosis. To help prevent osteoporosis or the bone fractures that can happen because of osteoporosis, the following is recommended:  If you are 46-71 years old, get at least 1,000 mg of calcium and at least 600 mg of vitamin D per day.  If you are older than age 55 but younger than age 65, get at least 1,200 mg of calcium and at least 600 mg of vitamin D per day.  If you are older than age 54, get at least 1,200 mg of calcium and at least 800 mg of vitamin D per day.  Smoking and excessive alcohol intake increase the risk of osteoporosis. Eat foods that are rich in calcium and vitamin D, and do weight-bearing exercises several times each week as directed by your health care provider. What should I know about how menopause affects my mental health? Depression may occur at any age, but it is more common as you become older. Common symptoms of depression  include:  Low or sad mood.  Changes in sleep patterns.  Changes in appetite or eating patterns.  Feeling an overall lack of motivation or enjoyment of activities that you previously enjoyed.  Frequent crying spells.  Talk with your health care provider if you think that you are experiencing depression. What should I know about immunizations? It is important that you get and maintain your immunizations. These include:  Tetanus, diphtheria, and pertussis (Tdap) booster vaccine.  Influenza every year before the flu season begins.  Pneumonia vaccine.  Shingles vaccine.  Your health care provider may also recommend other immunizations. This information is not intended to replace advice given to you by your health care provider. Make sure you discuss any questions you have with your health care provider. Document Released: 05/21/2005  Document Revised: 10/17/2015 Document Reviewed: 12/31/2014 Elsevier Interactive Patient Education  2018 [employer] Inc.  

## 2017-12-06 NOTE — Progress Notes (Signed)
Vicki Quinn [birth year] [id number]    History:    Presents for breast and pelvic exam.  TVH for fibroids.  Normal Pap and mammogram history.  2017 T score -2.1 femoral neck.  HSV 1 rare outbreaks.  2016 2 benign colon polyps 5-year follow-up.  Current on vaccines.  Primary care manages labs.  [facility]  Rare sexual activity.  Past medical history, past surgical history, family history and social history were all reviewed and documented in the EPIC chart.  Husband had hip surgery recently starting to get around a little easier.  ROS:  A ROS was performed and pertinent positives and negatives are included.  Exam:  Vitals:   12/06/17 1440  BP: 118/80  Weight: 152 lb (68.9 kg)  Height: 5\' 4"  (1.626 m)   Body mass index is 26.09 kg/m.   General appearance:  Normal Thyroid:  Symmetrical, normal in size, without palpable masses or nodularity. Respiratory  Auscultation:  Clear without wheezing or rhonchi Cardiovascular  Auscultation:  Regular rate, without rubs, murmurs or gallops  Edema/varicosities:  Not grossly evident Abdominal  Soft,nontender, without masses, guarding or rebound.  Liver/spleen:  No organomegaly noted  Hernia:  None appreciated  Skin  Inspection:  Grossly normal   Breasts: Examined lying and sitting.     Right: Without masses, retractions, discharge or axillary adenopathy.     Left: Without masses, retractions, discharge or axillary adenopathy. Gentitourinary   Inguinal/mons:  Normal without inguinal adenopathy  External genitalia:  Normal  BUS/Urethra/Skene's glands:  Normal  Vagina: Atrophic   Cervix: And uterus absent adnexa/parametria:     Rt: Without masses or tenderness.   Lt: Without masses or tenderness.  Anus and perineum: Normal  Digital rectal exam: Normal sphincter tone without palpated masses or tenderness  Assessment/Plan:  72 y.o. MWF G0 for breast and pelvic exam complaint of vaginal itching - wet prep negative.  TVH on no  HRT/Vaginal atrophy Anxiety/depression primary care manages labs and meds HSV 1 rare outbreaks. Osteopenia without elevated FRAX  Plan: Reviewed vaginal dryness most likely causing vaginal irritation we will also try some Mycolog twice daily you small amount prescription given.  Repeat DEXA.  Home safety, fall prevention and importance of weightbearing and balance type exercise reviewed.  [facility]  Valtrex 500 twice daily for 3 to 5 days as needed as needed for Rx.   Huel Cote [facility], 2:45 PM 12/06/2017

## 2018-01-02 ENCOUNTER — Ambulatory Visit (INDEPENDENT_AMBULATORY_CARE_PROVIDER_SITE_OTHER): Payer: MEDICARE

## 2018-01-02 ENCOUNTER — Other Ambulatory Visit: Payer: Self-pay | Admitting: Gynecology

## 2018-01-02 DIAGNOSIS — M8589 Other specified disorders of bone density and structure, multiple sites: Secondary | ICD-10-CM

## 2018-01-02 DIAGNOSIS — E2839 Other primary ovarian failure: Secondary | ICD-10-CM

## 2018-01-02 DIAGNOSIS — Z780 Asymptomatic menopausal state: Secondary | ICD-10-CM

## 2018-01-03 ENCOUNTER — Encounter: Payer: Self-pay | Admitting: Gynecology

## 2018-01-05 DIAGNOSIS — H52223 Regular astigmatism, bilateral: Secondary | ICD-10-CM | POA: Diagnosis not present

## 2018-01-05 DIAGNOSIS — H3554 Dystrophies primarily involving the retinal pigment epithelium: Secondary | ICD-10-CM | POA: Diagnosis not present

## 2018-01-05 DIAGNOSIS — H47292 Other optic atrophy, left eye: Secondary | ICD-10-CM | POA: Diagnosis not present

## 2018-01-05 DIAGNOSIS — Z961 Presence of intraocular lens: Secondary | ICD-10-CM | POA: Diagnosis not present

## 2018-01-05 DIAGNOSIS — H524 Presbyopia: Secondary | ICD-10-CM | POA: Diagnosis not present

## 2018-01-05 DIAGNOSIS — H353131 Nonexudative age-related macular degeneration, bilateral, early dry stage: Secondary | ICD-10-CM | POA: Diagnosis not present

## 2018-01-05 DIAGNOSIS — H43813 Vitreous degeneration, bilateral: Secondary | ICD-10-CM | POA: Diagnosis not present

## 2018-01-06 DIAGNOSIS — Z23 Encounter for immunization: Secondary | ICD-10-CM | POA: Diagnosis not present

## 2018-02-10 DIAGNOSIS — R1114 Bilious vomiting: Secondary | ICD-10-CM | POA: Diagnosis not present

## 2018-02-22 DIAGNOSIS — E7800 Pure hypercholesterolemia, unspecified: Secondary | ICD-10-CM | POA: Diagnosis not present

## 2018-02-22 DIAGNOSIS — E559 Vitamin D deficiency, unspecified: Secondary | ICD-10-CM | POA: Diagnosis not present

## 2018-02-22 DIAGNOSIS — N390 Urinary tract infection, site not specified: Secondary | ICD-10-CM | POA: Diagnosis not present

## 2018-02-27 DIAGNOSIS — H9190 Unspecified hearing loss, unspecified ear: Secondary | ICD-10-CM | POA: Diagnosis not present

## 2018-02-27 DIAGNOSIS — R5383 Other fatigue: Secondary | ICD-10-CM | POA: Diagnosis not present

## 2018-02-27 DIAGNOSIS — Z0000 Encounter for general adult medical examination without abnormal findings: Secondary | ICD-10-CM | POA: Diagnosis not present

## 2018-02-27 DIAGNOSIS — F429 Obsessive-compulsive disorder, unspecified: Secondary | ICD-10-CM | POA: Diagnosis not present

## 2018-02-27 DIAGNOSIS — E559 Vitamin D deficiency, unspecified: Secondary | ICD-10-CM | POA: Diagnosis not present

## 2018-02-27 DIAGNOSIS — E7800 Pure hypercholesterolemia, unspecified: Secondary | ICD-10-CM | POA: Diagnosis not present

## 2018-02-27 DIAGNOSIS — L80 Vitiligo: Secondary | ICD-10-CM | POA: Diagnosis not present

## 2018-02-27 DIAGNOSIS — R110 Nausea: Secondary | ICD-10-CM | POA: Diagnosis not present

## 2018-02-27 DIAGNOSIS — B001 Herpesviral vesicular dermatitis: Secondary | ICD-10-CM | POA: Diagnosis not present

## 2018-02-27 DIAGNOSIS — R1013 Epigastric pain: Secondary | ICD-10-CM | POA: Diagnosis not present

## 2018-04-03 DIAGNOSIS — F429 Obsessive-compulsive disorder, unspecified: Secondary | ICD-10-CM | POA: Diagnosis not present

## 2018-04-18 ENCOUNTER — Other Ambulatory Visit: Payer: Self-pay | Admitting: Women's Health

## 2018-04-18 DIAGNOSIS — B009 Herpesviral infection, unspecified: Secondary | ICD-10-CM

## 2018-04-18 NOTE — Telephone Encounter (Signed)
Ok for refill? 

## 2018-09-18 DIAGNOSIS — F429 Obsessive-compulsive disorder, unspecified: Secondary | ICD-10-CM | POA: Diagnosis not present

## 2018-11-09 ENCOUNTER — Other Ambulatory Visit: Payer: Self-pay

## 2018-11-30 DIAGNOSIS — Z1231 Encounter for screening mammogram for malignant neoplasm of breast: Secondary | ICD-10-CM | POA: Diagnosis not present

## 2018-12-27 ENCOUNTER — Other Ambulatory Visit: Payer: Self-pay | Admitting: Emergency Medicine

## 2018-12-27 DIAGNOSIS — Z20822 Contact with and (suspected) exposure to covid-19: Secondary | ICD-10-CM

## 2018-12-28 LAB — NOVEL CORONAVIRUS, NAA: SARS-CoV-2, NAA: NOT DETECTED

## 2019-01-17 ENCOUNTER — Encounter: Payer: Self-pay | Admitting: Gynecology

## 2019-01-19 DIAGNOSIS — Z23 Encounter for immunization: Secondary | ICD-10-CM | POA: Diagnosis not present

## 2019-03-21 DIAGNOSIS — E7800 Pure hypercholesterolemia, unspecified: Secondary | ICD-10-CM | POA: Diagnosis not present

## 2019-03-21 DIAGNOSIS — Z1159 Encounter for screening for other viral diseases: Secondary | ICD-10-CM | POA: Diagnosis not present

## 2019-03-22 DIAGNOSIS — F429 Obsessive-compulsive disorder, unspecified: Secondary | ICD-10-CM | POA: Diagnosis not present

## 2019-05-20 ENCOUNTER — Ambulatory Visit: Payer: MEDICARE | Attending: Internal Medicine

## 2019-05-20 DIAGNOSIS — Z23 Encounter for immunization: Secondary | ICD-10-CM

## 2019-05-20 NOTE — Progress Notes (Signed)
   Covid-19 Vaccination Clinic  Name:  Vicki Quinn    MRN: [id number] DOB: 12-19-1945  05/20/2019  [name] was observed post Covid-19 immunization for 15 minutes without incidence. She was provided with Vaccine Information Sheet and instruction to access the V-Safe system.   [name] was instructed to call 911 with any severe reactions post vaccine: Marland Kitchen Difficulty breathing  . Swelling of your face and throat  . A fast heartbeat  . A bad rash all over your body  . Dizziness and weakness    Immunizations Administered    Name Date Dose VIS Date Route   Pfizer COVID-19 Vaccine 05/20/2019  2:44 PM 0.3 mL 03/23/2019 Intramuscular   Manufacturer: [place]   Lot: [id number]   [place]: [id number]

## 2019-06-04 ENCOUNTER — Ambulatory Visit: Payer: MEDICARE

## 2019-06-13 ENCOUNTER — Ambulatory Visit: Payer: MEDICARE

## 2019-06-13 ENCOUNTER — Ambulatory Visit: Payer: MEDICARE | Attending: Internal Medicine

## 2019-06-13 DIAGNOSIS — Z23 Encounter for immunization: Secondary | ICD-10-CM | POA: Insufficient documentation

## 2019-06-13 NOTE — Progress Notes (Signed)
   Covid-19 Vaccination Clinic  Name:  CIEL BOECKMANN    MRN: [id number] DOB: 04-30-1945  06/13/2019  Ms. Gosdin was observed post Covid-19 immunization for 15 minutes without incident. She was provided with Vaccine Information Sheet and instruction to access the V-Safe system.   Ms. Cantore was instructed to call 911 with any severe reactions post vaccine: Marland Kitchen Difficulty breathing  . Swelling of face and throat  . A fast heartbeat  . A bad rash all over body  . Dizziness and weakness   Immunizations Administered    Name Date Dose VIS Date Route   Pfizer COVID-19 Vaccine 06/13/2019 10:10 AM 0.3 mL 03/23/2019 Intramuscular   Manufacturer: [place]   Lot: [id number]   [place]: [id number]

## 2019-06-14 ENCOUNTER — Other Ambulatory Visit: Payer: Self-pay | Admitting: Women's Health

## 2019-06-14 DIAGNOSIS — B009 Herpesviral infection, unspecified: Secondary | ICD-10-CM

## 2019-07-27 ENCOUNTER — Other Ambulatory Visit: Payer: Self-pay

## 2019-07-27 ENCOUNTER — Ambulatory Visit (INDEPENDENT_AMBULATORY_CARE_PROVIDER_SITE_OTHER): Payer: MEDICARE | Admitting: Physician Assistant

## 2019-07-27 ENCOUNTER — Encounter: Payer: Self-pay | Admitting: Physician Assistant

## 2019-07-27 DIAGNOSIS — D3617 Benign neoplasm of peripheral nerves and autonomic nervous system of trunk, unspecified: Secondary | ICD-10-CM | POA: Diagnosis not present

## 2019-07-27 DIAGNOSIS — Z1283 Encounter for screening for malignant neoplasm of skin: Secondary | ICD-10-CM

## 2019-07-27 DIAGNOSIS — D489 Neoplasm of uncertain behavior, unspecified: Secondary | ICD-10-CM

## 2019-07-27 DIAGNOSIS — L820 Inflamed seborrheic keratosis: Secondary | ICD-10-CM

## 2019-07-27 DIAGNOSIS — D485 Neoplasm of uncertain behavior of skin: Secondary | ICD-10-CM

## 2019-07-27 MED ORDER — VALACYCLOVIR HCL 1 G PO TABS: 500.0000 mg | ORAL_TABLET | Freq: Every day | ORAL | 3 refills | Status: DC

## 2019-07-27 NOTE — Patient Instructions (Signed)

## 2019-08-01 NOTE — Progress Notes (Deleted)
Follow-Up Visit   Subjective  Vicki Quinn is a 74 y.o. female who presents for the following: Annual Exam (general skin check has spots on chest no bleeding).   Location: CHEST Duration: YEARS  Quality: THICK CRUSTS Associated Signs/Symptoms: IRRITATED Modifying Factors: PERSISTENT Severity: GROWING    The following portions of the chart were reviewed this encounter and updated as appropriate: Tobacco  Allergies  Meds  Problems  Med Hx  Surg Hx  Fam Hx  Soc Hx      Objective  Well appearing patient in no apparent distress; mood and affect are within normal limits.  A full examination was performed including scalp, head, eyes, ears, nose, lips, neck, chest, axillae, abdomen, back, buttocks, bilateral upper extremities, bilateral lower extremities, hands, feet, fingers, toes, fingernails, and toenails. All findings within normal limits unless otherwise noted below.  Objective  Mid chest upper: Thin scaly erythematous papules coalescing to plaques.      Objective  Sternum: Thin scaly erythematous papules coalescing to plaques.      Objective  between breast: Thin scaly erythematous papules coalescing to plaques.      Objective  Left Chest: Thin scaly erythematous papules coalescing to plaques.      Assessment & Plan  Neoplasm of uncertain behavior (4) Mid chest upper  Skin / nail biopsy Type of biopsy: tangential   Informed consent: discussed and consent obtained   Timeout: patient name, date of birth, surgical site, and procedure verified   Procedure prep:  Patient was prepped and draped in usual sterile fashion Prep type:  Isopropyl alcohol Anesthesia: the lesion was anesthetized in a standard fashion   Anesthetic:  1% lidocaine w/ epinephrine 1-100,000 buffered w/ 8.4% NaHCO3 Instrument used: flexible razor blade   Hemostasis achieved with: pressure, aluminum chloride and electrodesiccation   Outcome: patient tolerated procedure well    Post-procedure details: sterile dressing applied and wound care instructions given   Dressing type: bandage and petrolatum    Specimen 1 - Surgical pathology Differential Diagnosis: SK Check Margins: No Thin scaly erythematous papules coalescing to plaques.  Sternum  Skin / nail biopsy Type of biopsy: tangential   Informed consent: discussed and consent obtained   Timeout: patient name, date of birth, surgical site, and procedure verified   Procedure prep:  Patient was prepped and draped in usual sterile fashion Prep type:  Isopropyl alcohol Anesthesia: the lesion was anesthetized in a standard fashion   Anesthetic:  1% lidocaine w/ epinephrine 1-100,000 buffered w/ 8.4% NaHCO3 Instrument used: flexible razor blade   Hemostasis achieved with: pressure, aluminum chloride and electrodesiccation   Outcome: patient tolerated procedure well   Post-procedure details: sterile dressing applied and wound care instructions given   Dressing type: bandage and petrolatum    Specimen 2 - Surgical pathology Differential Diagnosis: SK Check Margins: No Thin scaly erythematous papules coalescing to plaques.  between breast  Skin / nail biopsy Type of biopsy: tangential   Informed consent: discussed and consent obtained   Timeout: patient name, date of birth, surgical site, and procedure verified   Procedure prep:  Patient was prepped and draped in usual sterile fashion Prep type:  Isopropyl alcohol Anesthesia: the lesion was anesthetized in a standard fashion   Anesthetic:  1% lidocaine w/ epinephrine 1-100,000 buffered w/ 8.4% NaHCO3 Instrument used: flexible razor blade   Hemostasis achieved with: pressure, aluminum chloride and electrodesiccation   Outcome: patient tolerated procedure well   Post-procedure details: sterile dressing applied and wound care instructions  given   Dressing type: bandage and petrolatum    Specimen 4 - Surgical pathology Differential Diagnosis:sk Check  Margins: No Thin scaly erythematous papules coalescing to plaques.  Left Chest  Skin / nail biopsy Type of biopsy: tangential   Informed consent: discussed and consent obtained   Timeout: patient name, date of birth, surgical site, and procedure verified   Anesthesia: the lesion was anesthetized in a standard fashion   Anesthetic:  1% lidocaine w/ epinephrine 1-100,000 local infiltration Instrument used: flexible razor blade   Hemostasis achieved with: aluminum chloride and electrodesiccation   Outcome: patient tolerated procedure well   Post-procedure details: wound care instructions given    Specimen 3 - Surgical pathology Differential Diagnosis:SK Check Margins: No Thin scaly erythematous papules coalescing to plaques.

## 2019-08-30 NOTE — Addendum Note (Signed)
Addended by: Ashok Cordia B on: 08/30/2019 10:02 AM   Modules accepted: Orders

## 2019-09-20 DIAGNOSIS — F429 Obsessive-compulsive disorder, unspecified: Secondary | ICD-10-CM | POA: Diagnosis not present

## 2019-11-19 NOTE — Progress Notes (Addendum)
Follow-Up Visit   Subjective  Vicki Quinn is a 74 y.o. female who presents for the following: Annual Exam (general skin check has spots on chest no bleeding).   The following portions of the chart were reviewed this encounter and updated as appropriate: Tobacco  Allergies  Meds  Problems  Med Hx  Surg Hx  Fam Hx  Soc Hx      Objective  Well appearing patient in no apparent distress; mood and affect are within normal limits.  A full examination was performed including scalp, head, eyes, ears, nose, lips, neck, chest, axillae, abdomen, back, buttocks, bilateral upper extremities, bilateral lower extremities, hands, feet, fingers, toes, fingernails, and toenails. All findings within normal limits unless otherwise noted below.  Objective  Mid chest upper: Thin scaly erythematous papules coalescing to plaques.      Objective  Sternum: Thin scaly erythematous papules coalescing to plaques.      Objective  between breast: Thin scaly erythematous papules coalescing to plaques.      Objective  Left Chest: Thin scaly erythematous papules coalescing to plaques.      Assessment & Plan  Neoplasm of uncertain behavior (4) Mid chest upper  Skin / nail biopsy Type of biopsy: tangential   Informed consent: discussed and consent obtained   Timeout: patient name, date of birth, surgical site, and procedure verified   Procedure prep:  Patient was prepped and draped in usual sterile fashion Prep type:  Isopropyl alcohol Anesthesia: the lesion was anesthetized in a standard fashion   Anesthetic:  1% lidocaine w/ epinephrine 1-100,000 buffered w/ 8.4% NaHCO3 Instrument used: flexible razor blade   Hemostasis achieved with: pressure, aluminum chloride and electrodesiccation   Outcome: patient tolerated procedure well   Post-procedure details: sterile dressing applied and wound care instructions given   Dressing type: bandage and petrolatum    Specimen 1 - Surgical  pathology Differential Diagnosis: SK Check Margins: No Thin scaly erythematous papules coalescing to plaques.  Sternum  Skin / nail biopsy Type of biopsy: tangential   Informed consent: discussed and consent obtained   Timeout: patient name, date of birth, surgical site, and procedure verified   Procedure prep:  Patient was prepped and draped in usual sterile fashion Prep type:  Isopropyl alcohol Anesthesia: the lesion was anesthetized in a standard fashion   Anesthetic:  1% lidocaine w/ epinephrine 1-100,000 buffered w/ 8.4% NaHCO3 Instrument used: flexible razor blade   Hemostasis achieved with: pressure, aluminum chloride and electrodesiccation   Outcome: patient tolerated procedure well   Post-procedure details: sterile dressing applied and wound care instructions given   Dressing type: bandage and petrolatum    Specimen 2 - Surgical pathology Differential Diagnosis: SK Check Margins: No Thin scaly erythematous papules coalescing to plaques.  between breast  Skin / nail biopsy Type of biopsy: tangential   Informed consent: discussed and consent obtained   Timeout: patient name, date of birth, surgical site, and procedure verified   Procedure prep:  Patient was prepped and draped in usual sterile fashion Prep type:  Isopropyl alcohol Anesthesia: the lesion was anesthetized in a standard fashion   Anesthetic:  1% lidocaine w/ epinephrine 1-100,000 buffered w/ 8.4% NaHCO3 Instrument used: flexible razor blade   Hemostasis achieved with: pressure, aluminum chloride and electrodesiccation   Outcome: patient tolerated procedure well   Post-procedure details: sterile dressing applied and wound care instructions given   Dressing type: bandage and petrolatum    Specimen 4 - Surgical pathology Differential Diagnosis:sk Check  Margins: No Thin scaly erythematous papules coalescing to plaques.  Left Chest  Skin / nail biopsy Type of biopsy: tangential   Informed consent:  discussed and consent obtained   Timeout: patient name, date of birth, surgical site, and procedure verified   Anesthesia: the lesion was anesthetized in a standard fashion   Anesthetic:  1% lidocaine w/ epinephrine 1-100,000 local infiltration Instrument used: flexible razor blade   Hemostasis achieved with: aluminum chloride and electrodesiccation   Outcome: patient tolerated procedure well   Post-procedure details: wound care instructions given    Specimen 3 - Surgical pathology Differential Diagnosis:SK Check Margins: No Thin scaly erythematous papules coalescing to plaques.   I, Levie Owensby, PA-C, have reviewed all documentation's for this visit.  The documentation on 11/20/19 for the exam, diagnosis, procedures and orders are all accurate and complete.

## 2019-12-08 IMAGING — DX DG KNEE AP/LAT W/ SUNRISE*R*
3 series · 3 of 3 positions shown · non-contrast
Comparison: None.

CLINICAL DATA: Chronic right knee pain.

EXAM:
RIGHT KNEE 3 VIEWS

[dg knee ap/lat w/ sunrise right (1 of 3)]
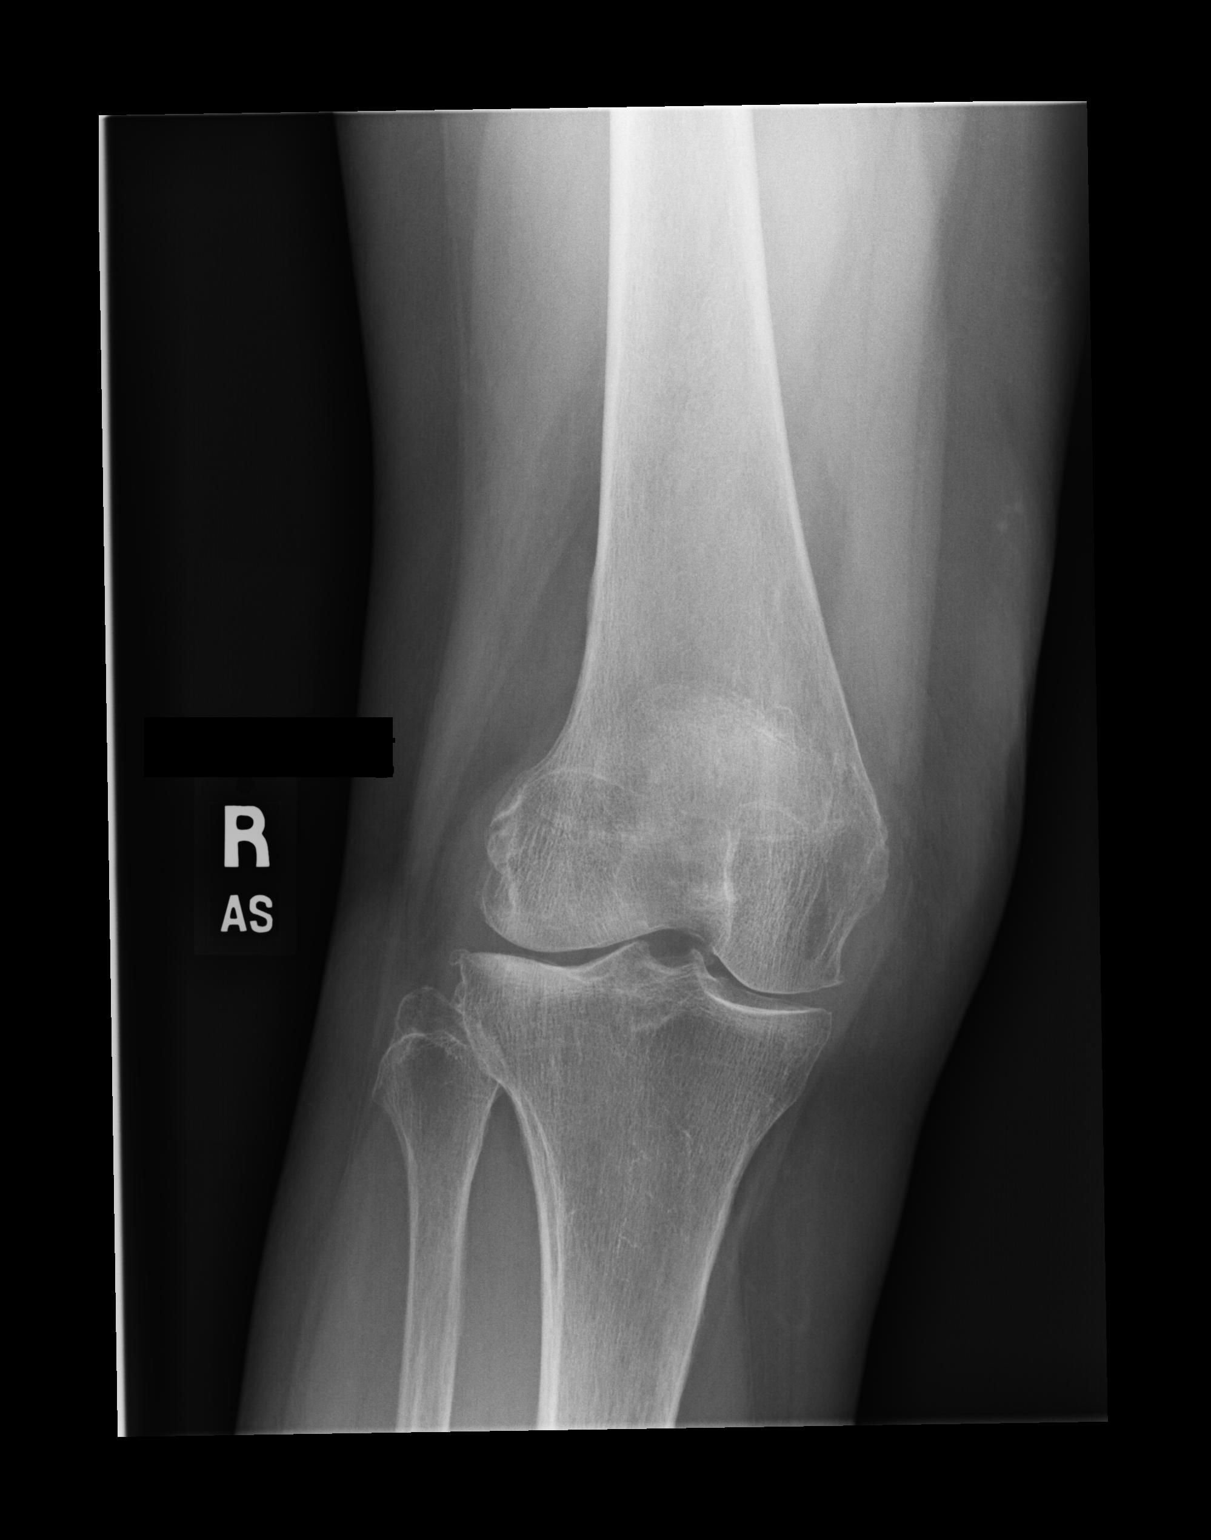

[dg knee ap/lat w/ sunrise right (2 of 3)]
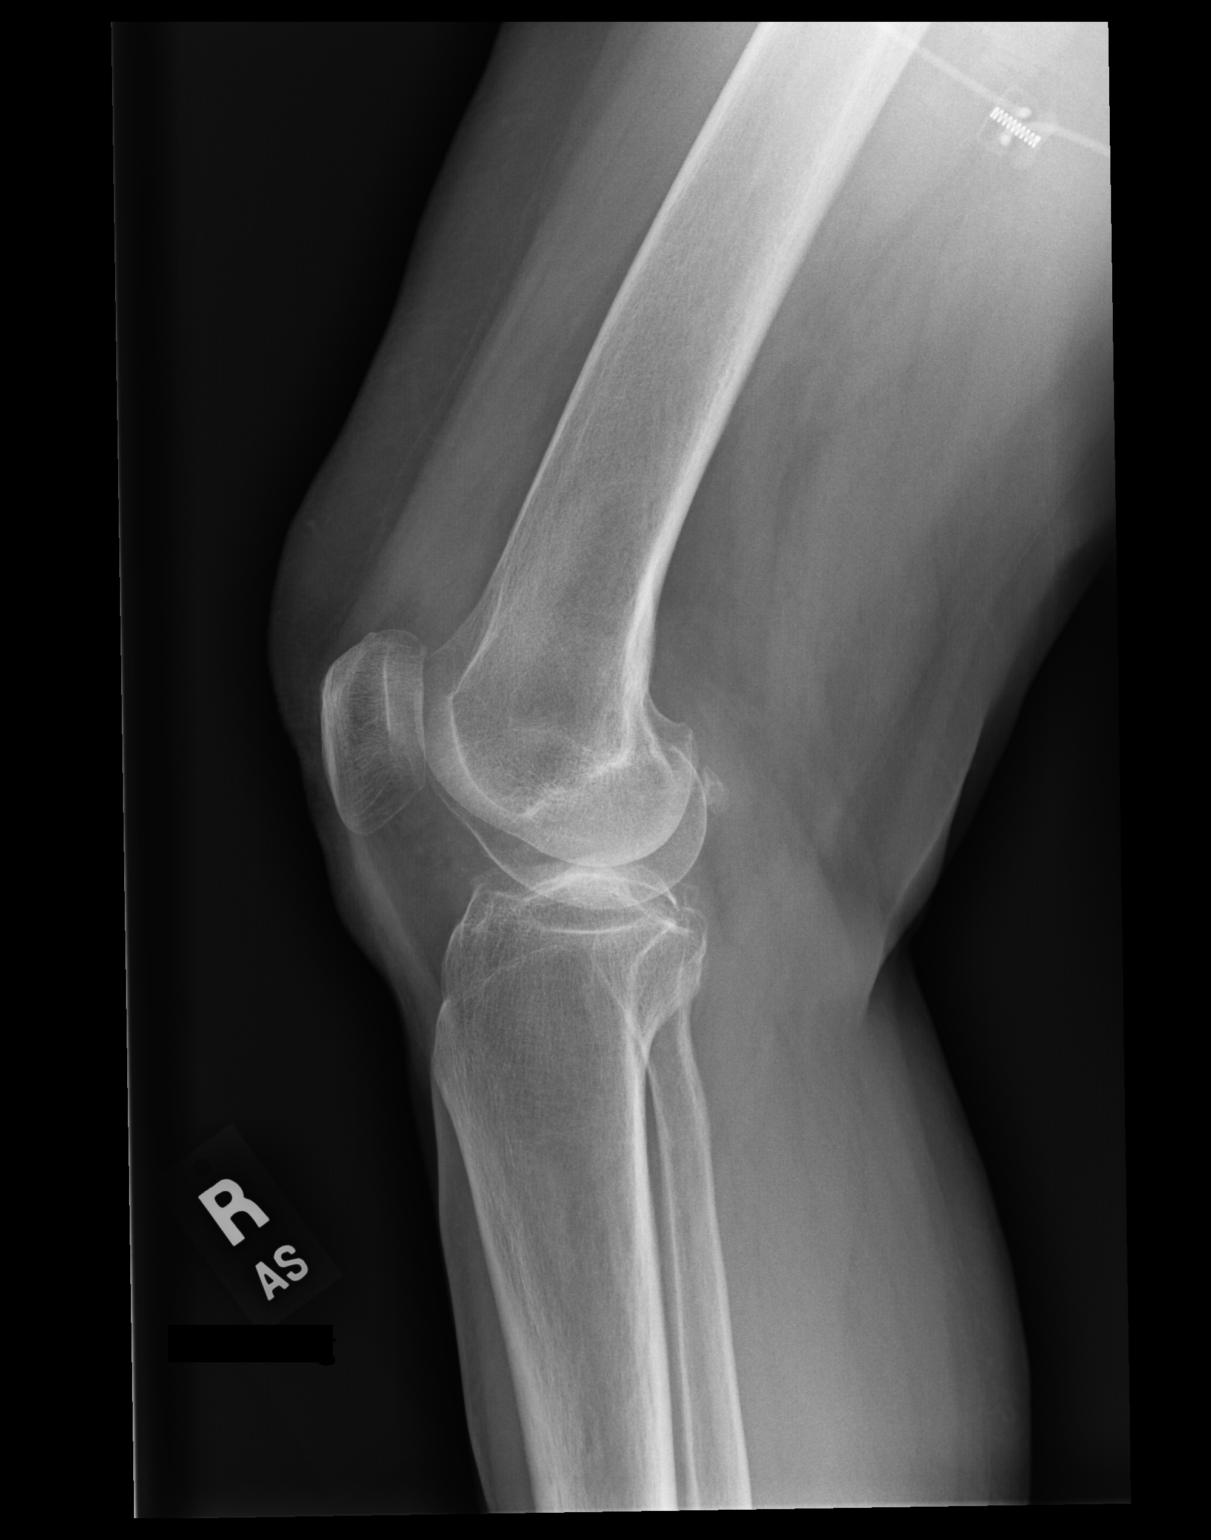

[dg knee ap/lat w/ sunrise right (3 of 3)]
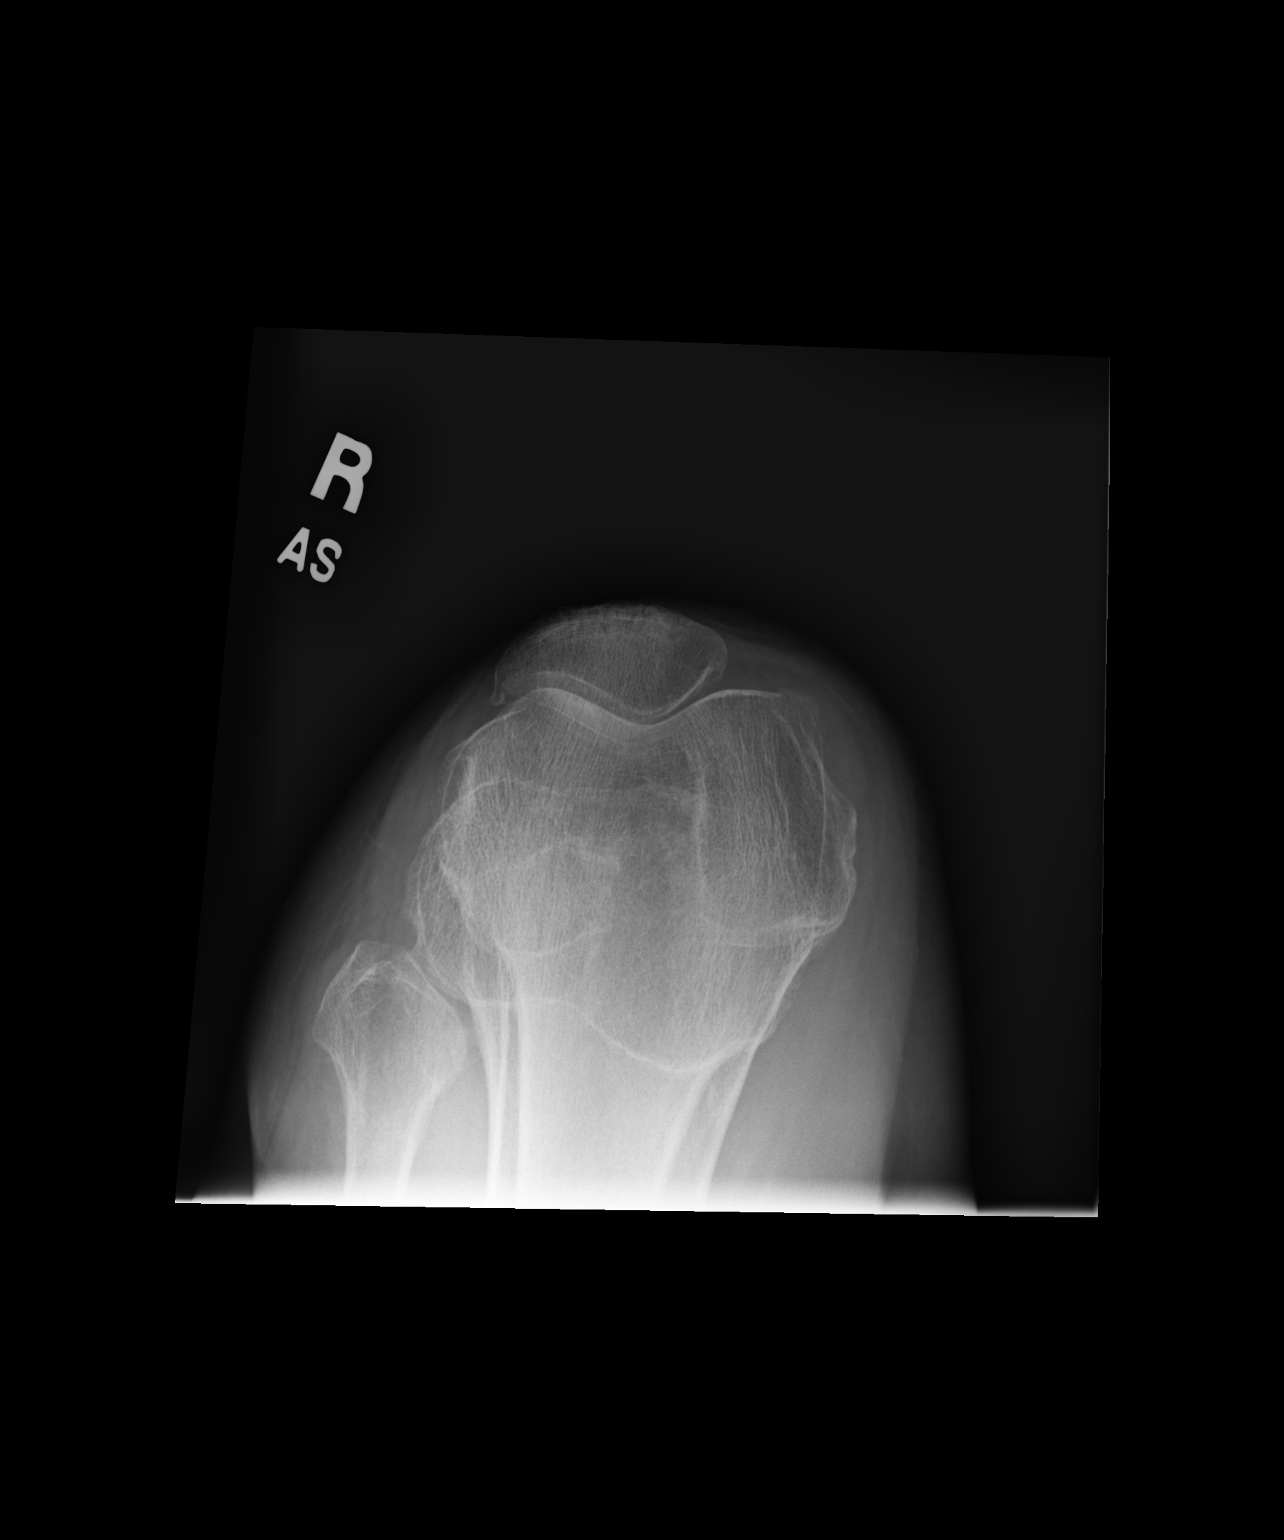

[3 of 3 positions shown; findings below may reference images not displayed]

FINDINGS: No acute fracture or dislocation. Small joint effusion. Mild
tricompartmental compartment joint space narrowing with small
marginal osteophytes. Osteopenia. Soft tissues are unremarkable.
IMPRESSION: 1. Mild tricompartmental osteoarthritis.

## 2019-12-10 ENCOUNTER — Ambulatory Visit (HOSPITAL_COMMUNITY): Payer: Self-pay

## 2019-12-11 DIAGNOSIS — K219 Gastro-esophageal reflux disease without esophagitis: Secondary | ICD-10-CM | POA: Diagnosis not present

## 2019-12-13 ENCOUNTER — Other Ambulatory Visit: Payer: Self-pay

## 2019-12-13 ENCOUNTER — Ambulatory Visit (INDEPENDENT_AMBULATORY_CARE_PROVIDER_SITE_OTHER): Payer: MEDICARE | Admitting: Nurse Practitioner

## 2019-12-13 ENCOUNTER — Encounter: Payer: Self-pay | Admitting: Nurse Practitioner

## 2019-12-13 VITALS — BP 118/80 | Ht 64.0 in | Wt 147.0 lb

## 2019-12-13 DIAGNOSIS — Z01419 Encounter for gynecological examination (general) (routine) without abnormal findings: Secondary | ICD-10-CM | POA: Diagnosis not present

## 2019-12-13 DIAGNOSIS — M8589 Other specified disorders of bone density and structure, multiple sites: Secondary | ICD-10-CM | POA: Diagnosis not present

## 2019-12-13 DIAGNOSIS — Z780 Asymptomatic menopausal state: Secondary | ICD-10-CM | POA: Diagnosis not present

## 2019-12-13 DIAGNOSIS — Z9289 Personal history of other medical treatment: Secondary | ICD-10-CM

## 2019-12-13 DIAGNOSIS — Z90710 Acquired absence of both cervix and uterus: Secondary | ICD-10-CM | POA: Diagnosis not present

## 2019-12-13 NOTE — Progress Notes (Signed)
   Vicki Quinn 1945/10/16 [id number]   History:  74 y.o. G0 presents for breast and pelvic exam. 1983 TVH for fibroids, no HRT. Normal pap and mammogram history. Osteopenia with treatment recommended after last bone density due to risk for hip fracture >3.0%, follow up was not done by patient at that time.   Gynecologic History No LMP recorded. Patient has had a hysterectomy.   Last Pap: no longer screening per guidelines Last mammogram: 11/30/2018. Results were: normal Last colonoscopy: 07/29/2014. Results were: normal Last Dexa: 01/02/2018. Results were: t-score -2.1, FRAX 13% / 3.3%  Past medical history, past surgical history, family history and social history were all reviewed and documented in the EPIC chart.  ROS:  A ROS was performed and pertinent positives and negatives are included.  Exam:  Vitals:   12/13/19 1158  BP: 118/80  Weight: 147 lb (66.7 kg)  Height: 5\' 4"  (1.626 m)   Body mass index is 25.23 kg/m.  General appearance:  Normal Thyroid:  Symmetrical, normal in size, without palpable masses or nodularity. Respiratory  Auscultation:  Clear without wheezing or rhonchi Cardiovascular  Auscultation:  Regular rate, without rubs, murmurs or gallops  Edema/varicosities:  Not grossly evident Abdominal  Soft,nontender, without masses, guarding or rebound.  Liver/spleen:  No organomegaly noted  Hernia:  None appreciated  Skin  Inspection:  Grossly normal   Breasts: Examined lying and sitting.   Right: Without masses, retractions, discharge or axillary adenopathy.   Left: Without masses, retractions, discharge or axillary adenopathy. Gentitourinary   Inguinal/mons:  Normal without inguinal adenopathy  External genitalia:  Normal  BUS/Urethra/Skene's glands:  Normal  Vagina:  Atrophic changes  Cervix:  Absent  Uterus:  Absent  Adnexa/parametria:     Rt: Without masses or tenderness.   Lt: Without masses or tenderness.  Anus and perineum: Normal  Digital  rectal exam: Normal sphincter tone without palpated masses or tenderness  Assessment/Plan:  74 y.o. G0 for breast and pelvic exam.    Well female exam with routine gynecological exam - Education provided on SBEs, importance of preventative screenings, current guidelines, high calcium diet, regular exercise, and multivitamin daily.   History of total vaginal hysterectomy (TVH) - 1983, no HRT  Osteopenia of multiple sites - Most recent T-score -2.1, FRAX 13% / 3.3% in September 2019. It was recommended she follow up to discuss medication management at that time due to risk of hip fracture >3% but she did not follow up. She walks and does weight lifting regularly up until 1 month ago when she wasn't feeling well. Takes a daily Vitamin D supplement.   Postmenopausal - Plan: DG Bone Density  Follow up in 1 year for annual     [place], 12:12 PM 12/13/2019

## 2019-12-13 NOTE — Patient Instructions (Signed)
Health Maintenance After Age 74 After age 74, you are at a higher risk for certain long-term diseases and infections as well as injuries from falls. Falls are a major cause of broken bones and head injuries in people who are older than age 74. Getting regular preventive care can help to keep you healthy and well. Preventive care includes getting regular testing and making lifestyle changes as recommended by your health care provider. Talk with your health care provider about:  Which screenings and tests you should have. A screening is a test that checks for a disease when you have no symptoms.  A diet and exercise plan that is right for you. What should I know about screenings and tests to prevent falls? Screening and testing are the best ways to find a health problem early. Early diagnosis and treatment give you the best chance of managing medical conditions that are common after age 74. Certain conditions and lifestyle choices may make you more likely to have a fall. Your health care provider may recommend:  Regular vision checks. Poor vision and conditions such as cataracts can make you more likely to have a fall. If you wear glasses, make sure to get your prescription updated if your vision changes.  Medicine review. Work with your health care provider to regularly review all of the medicines you are taking, including over-the-counter medicines. Ask your health care provider about any side effects that may make you more likely to have a fall. Tell your health care provider if any medicines that you take make you feel dizzy or sleepy.  Osteoporosis screening. Osteoporosis is a condition that causes the bones to get weaker. This can make the bones weak and cause them to break more easily.  Blood pressure screening. Blood pressure changes and medicines to control blood pressure can make you feel dizzy.  Strength and balance checks. Your health care provider may recommend certain tests to check your  strength and balance while standing, walking, or changing positions.  Foot health exam. Foot pain and numbness, as well as not wearing proper footwear, can make you more likely to have a fall.  Depression screening. You may be more likely to have a fall if you have a fear of falling, feel emotionally low, or feel unable to do activities that you used to do.  Alcohol use screening. Using too much alcohol can affect your balance and may make you more likely to have a fall. What actions can I take to lower my risk of falls? General instructions  Talk with your health care provider about your risks for falling. Tell your health care provider if: ? You fall. Be sure to tell your health care provider about all falls, even ones that seem minor. ? You feel dizzy, sleepy, or off-balance.  Take over-the-counter and prescription medicines only as told by your health care provider. These include any supplements.  Eat a healthy diet and maintain a healthy weight. A healthy diet includes low-fat dairy products, low-fat (lean) meats, and fiber from whole grains, beans, and lots of fruits and vegetables. Home safety  Remove any tripping hazards, such as rugs, cords, and clutter.  Install safety equipment such as grab bars in bathrooms and safety rails on stairs.  Keep rooms and walkways well-lit. Activity   Follow a regular exercise program to stay fit. This will help you maintain your balance. Ask your health care provider what types of exercise are appropriate for you.  If you need a cane or   walker, use it as recommended by your health care provider.  Wear supportive shoes that have nonskid soles. Lifestyle  Do not drink alcohol if your health care provider tells you not to drink.  If you drink alcohol, limit how much you have: ? 0-1 drink a day for women. ? 0-2 drinks a day for men.  Be aware of how much alcohol is in your drink. In the [place] one drink equals one typical bottle of beer (12  oz), one-half glass of wine (5 oz), or one shot of hard liquor (1 oz).  Do not use any products that contain nicotine or tobacco, such as cigarettes and e-cigarettes. If you need help quitting, ask your health care provider. Summary  Having a healthy lifestyle and getting preventive care can help to protect your health and wellness after age 74.  Screening and testing are the best way to find a health problem early and help you avoid having a fall. Early diagnosis and treatment give you the best chance for managing medical conditions that are more common for people who are older than age 74.  Falls are a major cause of broken bones and head injuries in people who are older than age 74. Take precautions to prevent a fall at home.  Work with your health care provider to learn what changes you can make to improve your health and wellness and to prevent falls. This information is not intended to replace advice given to you by your health care provider. Make sure you discuss any questions you have with your health care provider. Document Revised: 07/20/2018 Document Reviewed: 02/09/2017 Elsevier Patient Education  2020 [employer]  

## 2019-12-25 DIAGNOSIS — Z1231 Encounter for screening mammogram for malignant neoplasm of breast: Secondary | ICD-10-CM | POA: Diagnosis not present

## 2019-12-26 DIAGNOSIS — K219 Gastro-esophageal reflux disease without esophagitis: Secondary | ICD-10-CM | POA: Diagnosis not present

## 2020-01-04 DIAGNOSIS — K2100 Gastro-esophageal reflux disease with esophagitis, without bleeding: Secondary | ICD-10-CM | POA: Diagnosis not present

## 2020-01-11 DIAGNOSIS — Z23 Encounter for immunization: Secondary | ICD-10-CM | POA: Diagnosis not present

## 2020-01-15 ENCOUNTER — Ambulatory Visit: Payer: MEDICARE

## 2020-01-22 ENCOUNTER — Ambulatory Visit: Payer: MEDICARE | Attending: Internal Medicine

## 2020-01-22 DIAGNOSIS — Z23 Encounter for immunization: Secondary | ICD-10-CM

## 2020-01-22 NOTE — Progress Notes (Signed)
   Covid-19 Vaccination Clinic  Name:  Vicki Quinn    MRN: [id number] DOB: 1945/08/12  01/22/2020  Ms. Fitting was observed post Covid-19 immunization for 15 minutes without incident. She was provided with Vaccine Information Sheet and instruction to access the V-Safe system.   [name] was instructed to call 911 with any severe reactions post vaccine: Marland Kitchen Difficulty breathing  . Swelling of face and throat  . A fast heartbeat  . A bad rash all over body  . Dizziness and weakness

## 2020-02-05 DIAGNOSIS — Z1159 Encounter for screening for other viral diseases: Secondary | ICD-10-CM | POA: Diagnosis not present

## 2020-02-08 DIAGNOSIS — K317 Polyp of stomach and duodenum: Secondary | ICD-10-CM | POA: Diagnosis not present

## 2020-02-08 DIAGNOSIS — K219 Gastro-esophageal reflux disease without esophagitis: Secondary | ICD-10-CM | POA: Diagnosis not present

## 2020-02-08 DIAGNOSIS — K449 Diaphragmatic hernia without obstruction or gangrene: Secondary | ICD-10-CM | POA: Diagnosis not present

## 2020-02-08 DIAGNOSIS — K2210 Ulcer of esophagus without bleeding: Secondary | ICD-10-CM | POA: Diagnosis not present

## 2020-02-12 DIAGNOSIS — K219 Gastro-esophageal reflux disease without esophagitis: Secondary | ICD-10-CM | POA: Diagnosis not present

## 2020-03-26 DIAGNOSIS — E7800 Pure hypercholesterolemia, unspecified: Secondary | ICD-10-CM | POA: Diagnosis not present

## 2020-03-31 DIAGNOSIS — E7800 Pure hypercholesterolemia, unspecified: Secondary | ICD-10-CM | POA: Diagnosis not present

## 2020-03-31 DIAGNOSIS — R3129 Other microscopic hematuria: Secondary | ICD-10-CM | POA: Diagnosis not present

## 2020-03-31 DIAGNOSIS — K219 Gastro-esophageal reflux disease without esophagitis: Secondary | ICD-10-CM | POA: Diagnosis not present

## 2020-03-31 DIAGNOSIS — Z0001 Encounter for general adult medical examination with abnormal findings: Secondary | ICD-10-CM | POA: Diagnosis not present

## 2020-03-31 DIAGNOSIS — E559 Vitamin D deficiency, unspecified: Secondary | ICD-10-CM | POA: Diagnosis not present

## 2020-03-31 DIAGNOSIS — F429 Obsessive-compulsive disorder, unspecified: Secondary | ICD-10-CM | POA: Diagnosis not present

## 2020-03-31 DIAGNOSIS — G4700 Insomnia, unspecified: Secondary | ICD-10-CM | POA: Diagnosis not present

## 2020-05-15 DIAGNOSIS — F429 Obsessive-compulsive disorder, unspecified: Secondary | ICD-10-CM | POA: Diagnosis not present

## 2020-06-03 ENCOUNTER — Other Ambulatory Visit: Payer: Self-pay | Admitting: Nurse Practitioner

## 2020-06-03 ENCOUNTER — Other Ambulatory Visit: Payer: Self-pay

## 2020-06-03 ENCOUNTER — Ambulatory Visit (INDEPENDENT_AMBULATORY_CARE_PROVIDER_SITE_OTHER): Payer: MEDICARE

## 2020-06-03 DIAGNOSIS — Z780 Asymptomatic menopausal state: Secondary | ICD-10-CM

## 2020-06-03 DIAGNOSIS — M8589 Other specified disorders of bone density and structure, multiple sites: Secondary | ICD-10-CM

## 2020-06-11 DIAGNOSIS — M5441 Lumbago with sciatica, right side: Secondary | ICD-10-CM | POA: Diagnosis not present

## 2020-06-20 DIAGNOSIS — K219 Gastro-esophageal reflux disease without esophagitis: Secondary | ICD-10-CM | POA: Diagnosis not present

## 2020-06-24 DIAGNOSIS — M545 Low back pain, unspecified: Secondary | ICD-10-CM | POA: Diagnosis not present

## 2020-07-02 DIAGNOSIS — M545 Low back pain, unspecified: Secondary | ICD-10-CM | POA: Diagnosis not present

## 2020-07-08 DIAGNOSIS — M545 Low back pain, unspecified: Secondary | ICD-10-CM | POA: Diagnosis not present

## 2020-07-15 DIAGNOSIS — M545 Low back pain, unspecified: Secondary | ICD-10-CM | POA: Diagnosis not present

## 2020-07-25 DIAGNOSIS — M545 Low back pain, unspecified: Secondary | ICD-10-CM | POA: Diagnosis not present

## 2020-07-29 DIAGNOSIS — M545 Low back pain, unspecified: Secondary | ICD-10-CM | POA: Diagnosis not present

## 2020-07-30 DIAGNOSIS — H43813 Vitreous degeneration, bilateral: Secondary | ICD-10-CM | POA: Diagnosis not present

## 2020-07-30 DIAGNOSIS — H47292 Other optic atrophy, left eye: Secondary | ICD-10-CM | POA: Diagnosis not present

## 2020-07-30 DIAGNOSIS — H52223 Regular astigmatism, bilateral: Secondary | ICD-10-CM | POA: Diagnosis not present

## 2020-07-30 DIAGNOSIS — H524 Presbyopia: Secondary | ICD-10-CM | POA: Diagnosis not present

## 2020-07-30 DIAGNOSIS — Z961 Presence of intraocular lens: Secondary | ICD-10-CM | POA: Diagnosis not present

## 2020-07-30 DIAGNOSIS — H35363 Drusen (degenerative) of macula, bilateral: Secondary | ICD-10-CM | POA: Diagnosis not present

## 2020-07-30 DIAGNOSIS — H3554 Dystrophies primarily involving the retinal pigment epithelium: Secondary | ICD-10-CM | POA: Diagnosis not present

## 2020-07-30 DIAGNOSIS — H353131 Nonexudative age-related macular degeneration, bilateral, early dry stage: Secondary | ICD-10-CM | POA: Diagnosis not present

## 2020-08-05 DIAGNOSIS — M545 Low back pain, unspecified: Secondary | ICD-10-CM | POA: Diagnosis not present

## 2020-08-12 DIAGNOSIS — M545 Low back pain, unspecified: Secondary | ICD-10-CM | POA: Diagnosis not present

## 2020-08-26 DIAGNOSIS — Z23 Encounter for immunization: Secondary | ICD-10-CM | POA: Diagnosis not present

## 2020-09-09 DIAGNOSIS — M545 Low back pain, unspecified: Secondary | ICD-10-CM | POA: Diagnosis not present

## 2020-11-05 DIAGNOSIS — U071 COVID-19: Secondary | ICD-10-CM | POA: Diagnosis not present

## 2020-11-10 DIAGNOSIS — F429 Obsessive-compulsive disorder, unspecified: Secondary | ICD-10-CM | POA: Diagnosis not present

## 2020-12-16 ENCOUNTER — Ambulatory Visit (INDEPENDENT_AMBULATORY_CARE_PROVIDER_SITE_OTHER): Payer: MEDICARE | Admitting: Nurse Practitioner

## 2020-12-16 ENCOUNTER — Encounter: Payer: Self-pay | Admitting: Nurse Practitioner

## 2020-12-16 ENCOUNTER — Other Ambulatory Visit: Payer: Self-pay

## 2020-12-16 VITALS — BP 120/74 | Ht 63.0 in | Wt 147.0 lb

## 2020-12-16 DIAGNOSIS — Z780 Asymptomatic menopausal state: Secondary | ICD-10-CM

## 2020-12-16 DIAGNOSIS — M8589 Other specified disorders of bone density and structure, multiple sites: Secondary | ICD-10-CM

## 2020-12-16 DIAGNOSIS — Z01419 Encounter for gynecological examination (general) (routine) without abnormal findings: Secondary | ICD-10-CM

## 2020-12-16 NOTE — Progress Notes (Signed)
   Vicki Quinn 01/17/1946 [id number]   History:  [name] presents for breast and pelvic exam. No GYN complaints. Postmenopausal - no HRT. S/P 1983 TVH for fibroids. Normal pap and mammogram history. Osteopenia with increased risk of hip fracture, she is not interested in medication management at this time. Still having some residual fatigue from Covid infection in July.   Gynecologic History No LMP recorded. Patient has had a hysterectomy.   Last Pap: no longer screening per guidelines Last mammogram: 11/2019. Results were: normal Last colonoscopy: 07/29/2014. Results were: normal Last Dexa: 06/03/2020. Results were: t-score -2.3, FRAX 15% / 4.6%  Past medical history, past surgical history, family history and social history were all reviewed and documented in the EPIC chart. Married.   ROS:  A ROS was performed and pertinent positives and negatives are included.  Exam:  Vitals:   12/16/20 1057  BP: 120/74  Weight: 147 lb (66.7 kg)  Height: '5\' 3"'$  (1.6 m)    Body mass index is 26.04 kg/m.  General appearance:  Normal Thyroid:  Symmetrical, normal in size, without palpable masses or nodularity. Respiratory  Auscultation:  Clear without wheezing or rhonchi Cardiovascular  Auscultation:  Regular rate, without rubs, murmurs or gallops  Edema/varicosities:  Not grossly evident Abdominal  Soft,nontender, without masses, guarding or rebound.  Liver/spleen:  No organomegaly noted  Hernia:  None appreciated  Skin  Inspection:  Grossly normal   Breasts: Examined lying and sitting.   Right: Without masses, retractions, discharge or axillary adenopathy.   Left: Without masses, retractions, discharge or axillary adenopathy. Gentitourinary   Inguinal/mons:  Normal without inguinal adenopathy  External genitalia:  Normal  BUS/Urethra/Skene's glands:  Normal  Vagina:  Atrophic changes  Cervix:  Absent  Uterus:  Absent  Adnexa/parametria:     Rt: Without masses or  tenderness.   Lt: Without masses or tenderness.  Anus and perineum: Normal  Digital rectal exam: Normal sphincter tone without palpated masses or tenderness  Patient informed chaperone available to be present for breast and pelvic exam. Patient has requested no chaperone to be present. Patient has been advised what will be completed during breast and pelvic exam.   Assessment/Plan:  [name] for breast and pelvic exam.    Well female exam with routine gynecological exam - Education provided on SBEs, importance of preventative screenings, current guidelines, high calcium diet, regular exercise, and multivitamin daily. Labs with PCP.   Postmenopausal - no HRT. S/P 1983 TVH for fibroids.   Osteopenia of multiple sites - T-score -2.3 with elevated risk for hip fracture at 4.6%. Again discussed recommendation for medication management due to elevated FRAX. She is not interested and wants to optimize Calcium and Vitamin D. She is active with walking and resistance training.   Screening for cervical cancer - Normal Pap history.  No longer screening per guidelines.   Screening for breast cancer - Normal mammogram history.  Continue annual screenings.  Normal breast exam today.  Screening for colon cancer - 2016 colonoscopy. Will repeat at GI's recommended interval.   Follow up in 1 year for annual     [place], 11:25 AM 12/16/2020

## 2020-12-30 DIAGNOSIS — Z1231 Encounter for screening mammogram for malignant neoplasm of breast: Secondary | ICD-10-CM | POA: Diagnosis not present

## 2021-01-01 ENCOUNTER — Encounter: Payer: Self-pay | Admitting: Nurse Practitioner

## 2021-01-13 ENCOUNTER — Encounter: Payer: Self-pay | Admitting: Physician Assistant

## 2021-01-13 ENCOUNTER — Other Ambulatory Visit: Payer: Self-pay

## 2021-01-13 ENCOUNTER — Ambulatory Visit (INDEPENDENT_AMBULATORY_CARE_PROVIDER_SITE_OTHER): Payer: MEDICARE | Admitting: Physician Assistant

## 2021-01-13 DIAGNOSIS — Z1283 Encounter for screening for malignant neoplasm of skin: Secondary | ICD-10-CM

## 2021-01-13 DIAGNOSIS — L820 Inflamed seborrheic keratosis: Secondary | ICD-10-CM

## 2021-01-13 DIAGNOSIS — D485 Neoplasm of uncertain behavior of skin: Secondary | ICD-10-CM

## 2021-01-14 ENCOUNTER — Encounter: Payer: Self-pay | Admitting: Physician Assistant

## 2021-01-14 NOTE — Progress Notes (Signed)
   New Patient   Subjective  Vicki Quinn is a 75 y.o. female who presents for the following: Annual Exam (Patient here today for yearly skin check per patient she has a few lesions she would like checked all over no bleeding. No ).   The following portions of the chart were reviewed this encounter and updated as appropriate:  Tobacco  Allergies  Meds  Problems  Med Hx  Surg Hx  Fam Hx      Objective  Well appearing patient in no apparent distress; mood and affect are within normal limits.  A full examination was performed including scalp, head, eyes, ears, nose, lips, neck, chest, axillae, abdomen, back, buttocks, bilateral upper extremities, bilateral lower extremities, hands, feet, fingers, toes, fingernails, and toenails. All findings within normal limits unless otherwise noted below.  Right Upper Arm - Anterior Dark plaque  Left Abdomen (side) - Upper Dark plaque   Assessment & Plan  Neoplasm of uncertain behavior of skin (2) Right Upper Arm - Anterior  Skin / nail biopsy Type of biopsy: tangential   Informed consent: discussed and consent obtained   Timeout: patient name, date of birth, surgical site, and procedure verified   Procedure prep:  Patient was prepped and draped in usual sterile fashion (Non sterile) Prep type:  Chlorhexidine Anesthesia: the lesion was anesthetized in a standard fashion   Anesthetic:  1% lidocaine w/ epinephrine 1-100,000 local infiltration Instrument used: flexible razor blade   Hemostasis achieved with: aluminum chloride   Outcome: patient tolerated procedure well   Post-procedure details: sterile dressing applied and wound care instructions given   Dressing type: bandage and petrolatum   Additional details:  No photo no measurement per [facility]  Specimen 1 - Surgical pathology Differential Diagnosis: R/O ISK  Check Margins: No  Left Abdomen (side) - Upper  Skin / nail biopsy Type of biopsy: tangential   Informed consent:  discussed and consent obtained   Timeout: patient name, date of birth, surgical site, and procedure verified   Procedure prep:  Patient was prepped and draped in usual sterile fashion (Non sterile) Prep type:  Chlorhexidine Anesthesia: the lesion was anesthetized in a standard fashion   Anesthetic:  1% lidocaine w/ epinephrine 1-100,000 local infiltration Instrument used: flexible razor blade   Hemostasis achieved with: aluminum chloride   Outcome: patient tolerated procedure well   Post-procedure details: sterile dressing applied and wound care instructions given   Dressing type: bandage and petrolatum   Additional details:  No photo no measurement per [facility]  Specimen 2 - Surgical pathology Differential Diagnosis: R/O ISK  Check Margins: No     I, Lauris Keepers, PA-C, have reviewed all documentation's for this visit.  The documentation on 01/14/21 for the exam, diagnosis, procedures and orders are all accurate and complete.

## 2021-01-22 DIAGNOSIS — M542 Cervicalgia: Secondary | ICD-10-CM | POA: Diagnosis not present

## 2021-01-23 DIAGNOSIS — Z23 Encounter for immunization: Secondary | ICD-10-CM | POA: Diagnosis not present

## 2021-01-27 DIAGNOSIS — M542 Cervicalgia: Secondary | ICD-10-CM | POA: Diagnosis not present

## 2021-02-04 DIAGNOSIS — M542 Cervicalgia: Secondary | ICD-10-CM | POA: Diagnosis not present

## 2021-02-12 DIAGNOSIS — Z23 Encounter for immunization: Secondary | ICD-10-CM | POA: Diagnosis not present

## 2021-05-11 DIAGNOSIS — F429 Obsessive-compulsive disorder, unspecified: Secondary | ICD-10-CM | POA: Diagnosis not present

## 2021-05-11 DIAGNOSIS — F4323 Adjustment disorder with mixed anxiety and depressed mood: Secondary | ICD-10-CM | POA: Diagnosis not present

## 2021-06-02 DIAGNOSIS — F4323 Adjustment disorder with mixed anxiety and depressed mood: Secondary | ICD-10-CM | POA: Diagnosis not present

## 2021-06-02 DIAGNOSIS — F429 Obsessive-compulsive disorder, unspecified: Secondary | ICD-10-CM | POA: Diagnosis not present

## 2021-06-03 DIAGNOSIS — E7800 Pure hypercholesterolemia, unspecified: Secondary | ICD-10-CM | POA: Diagnosis not present

## 2021-06-03 DIAGNOSIS — Z0000 Encounter for general adult medical examination without abnormal findings: Secondary | ICD-10-CM | POA: Diagnosis not present

## 2021-06-03 DIAGNOSIS — R5383 Other fatigue: Secondary | ICD-10-CM | POA: Diagnosis not present

## 2021-06-03 DIAGNOSIS — Z79899 Other long term (current) drug therapy: Secondary | ICD-10-CM | POA: Diagnosis not present

## 2021-06-03 DIAGNOSIS — E559 Vitamin D deficiency, unspecified: Secondary | ICD-10-CM | POA: Diagnosis not present

## 2021-06-10 DIAGNOSIS — B009 Herpesviral infection, unspecified: Secondary | ICD-10-CM | POA: Diagnosis not present

## 2021-06-10 DIAGNOSIS — K219 Gastro-esophageal reflux disease without esophagitis: Secondary | ICD-10-CM | POA: Diagnosis not present

## 2021-06-10 DIAGNOSIS — Z86010 Personal history of colonic polyps: Secondary | ICD-10-CM | POA: Diagnosis not present

## 2021-06-10 DIAGNOSIS — F439 Reaction to severe stress, unspecified: Secondary | ICD-10-CM | POA: Diagnosis not present

## 2021-06-10 DIAGNOSIS — E7800 Pure hypercholesterolemia, unspecified: Secondary | ICD-10-CM | POA: Diagnosis not present

## 2021-06-10 DIAGNOSIS — Z0000 Encounter for general adult medical examination without abnormal findings: Secondary | ICD-10-CM | POA: Diagnosis not present

## 2021-07-02 DIAGNOSIS — F429 Obsessive-compulsive disorder, unspecified: Secondary | ICD-10-CM | POA: Diagnosis not present

## 2021-07-27 DIAGNOSIS — F429 Obsessive-compulsive disorder, unspecified: Secondary | ICD-10-CM | POA: Diagnosis not present

## 2021-07-27 DIAGNOSIS — F4323 Adjustment disorder with mixed anxiety and depressed mood: Secondary | ICD-10-CM | POA: Diagnosis not present

## 2021-08-03 DIAGNOSIS — F429 Obsessive-compulsive disorder, unspecified: Secondary | ICD-10-CM | POA: Diagnosis not present

## 2021-08-07 DIAGNOSIS — H353131 Nonexudative age-related macular degeneration, bilateral, early dry stage: Secondary | ICD-10-CM | POA: Diagnosis not present

## 2021-08-07 DIAGNOSIS — H3554 Dystrophies primarily involving the retinal pigment epithelium: Secondary | ICD-10-CM | POA: Diagnosis not present

## 2021-08-07 DIAGNOSIS — H52223 Regular astigmatism, bilateral: Secondary | ICD-10-CM | POA: Diagnosis not present

## 2021-08-07 DIAGNOSIS — H524 Presbyopia: Secondary | ICD-10-CM | POA: Diagnosis not present

## 2021-08-07 DIAGNOSIS — H47292 Other optic atrophy, left eye: Secondary | ICD-10-CM | POA: Diagnosis not present

## 2021-08-07 DIAGNOSIS — Z961 Presence of intraocular lens: Secondary | ICD-10-CM | POA: Diagnosis not present

## 2021-08-07 DIAGNOSIS — H3581 Retinal edema: Secondary | ICD-10-CM | POA: Diagnosis not present

## 2021-08-07 DIAGNOSIS — H43813 Vitreous degeneration, bilateral: Secondary | ICD-10-CM | POA: Diagnosis not present

## 2021-09-02 DIAGNOSIS — F429 Obsessive-compulsive disorder, unspecified: Secondary | ICD-10-CM | POA: Diagnosis not present

## 2021-10-05 DIAGNOSIS — F429 Obsessive-compulsive disorder, unspecified: Secondary | ICD-10-CM | POA: Diagnosis not present

## 2021-11-03 DIAGNOSIS — F429 Obsessive-compulsive disorder, unspecified: Secondary | ICD-10-CM | POA: Diagnosis not present

## 2021-11-23 DIAGNOSIS — F4323 Adjustment disorder with mixed anxiety and depressed mood: Secondary | ICD-10-CM | POA: Diagnosis not present

## 2021-11-23 DIAGNOSIS — F429 Obsessive-compulsive disorder, unspecified: Secondary | ICD-10-CM | POA: Diagnosis not present

## 2021-12-01 DIAGNOSIS — F4323 Adjustment disorder with mixed anxiety and depressed mood: Secondary | ICD-10-CM | POA: Diagnosis not present

## 2021-12-01 DIAGNOSIS — F429 Obsessive-compulsive disorder, unspecified: Secondary | ICD-10-CM | POA: Diagnosis not present

## 2021-12-30 DIAGNOSIS — F429 Obsessive-compulsive disorder, unspecified: Secondary | ICD-10-CM | POA: Diagnosis not present

## 2022-01-05 DIAGNOSIS — Z1231 Encounter for screening mammogram for malignant neoplasm of breast: Secondary | ICD-10-CM | POA: Diagnosis not present

## 2022-01-13 ENCOUNTER — Ambulatory Visit: Payer: MEDICARE | Admitting: Physician Assistant

## 2022-01-22 DIAGNOSIS — Z23 Encounter for immunization: Secondary | ICD-10-CM | POA: Diagnosis not present

## 2022-02-04 DIAGNOSIS — F4323 Adjustment disorder with mixed anxiety and depressed mood: Secondary | ICD-10-CM | POA: Diagnosis not present

## 2022-02-04 DIAGNOSIS — F429 Obsessive-compulsive disorder, unspecified: Secondary | ICD-10-CM | POA: Diagnosis not present

## 2022-02-18 DIAGNOSIS — F429 Obsessive-compulsive disorder, unspecified: Secondary | ICD-10-CM | POA: Diagnosis not present

## 2022-02-22 DIAGNOSIS — Z23 Encounter for immunization: Secondary | ICD-10-CM | POA: Diagnosis not present

## 2022-03-10 DIAGNOSIS — F429 Obsessive-compulsive disorder, unspecified: Secondary | ICD-10-CM | POA: Diagnosis not present

## 2022-03-24 DIAGNOSIS — H47333 Pseudopapilledema of optic disc, bilateral: Secondary | ICD-10-CM | POA: Diagnosis not present

## 2022-03-24 DIAGNOSIS — H3554 Dystrophies primarily involving the retinal pigment epithelium: Secondary | ICD-10-CM | POA: Diagnosis not present

## 2022-03-24 DIAGNOSIS — H353131 Nonexudative age-related macular degeneration, bilateral, early dry stage: Secondary | ICD-10-CM | POA: Diagnosis not present

## 2022-03-24 DIAGNOSIS — H47292 Other optic atrophy, left eye: Secondary | ICD-10-CM | POA: Diagnosis not present

## 2022-03-24 DIAGNOSIS — Z961 Presence of intraocular lens: Secondary | ICD-10-CM | POA: Diagnosis not present

## 2022-03-24 DIAGNOSIS — H3581 Retinal edema: Secondary | ICD-10-CM | POA: Diagnosis not present

## 2022-03-29 DIAGNOSIS — F4323 Adjustment disorder with mixed anxiety and depressed mood: Secondary | ICD-10-CM | POA: Diagnosis not present

## 2022-03-29 DIAGNOSIS — F429 Obsessive-compulsive disorder, unspecified: Secondary | ICD-10-CM | POA: Diagnosis not present

## 2022-03-31 DIAGNOSIS — F429 Obsessive-compulsive disorder, unspecified: Secondary | ICD-10-CM | POA: Diagnosis not present

## 2022-05-10 DIAGNOSIS — F429 Obsessive-compulsive disorder, unspecified: Secondary | ICD-10-CM | POA: Diagnosis not present

## 2022-05-13 DIAGNOSIS — H43813 Vitreous degeneration, bilateral: Secondary | ICD-10-CM | POA: Diagnosis not present

## 2022-05-13 DIAGNOSIS — H353131 Nonexudative age-related macular degeneration, bilateral, early dry stage: Secondary | ICD-10-CM | POA: Diagnosis not present

## 2022-05-13 DIAGNOSIS — H3554 Dystrophies primarily involving the retinal pigment epithelium: Secondary | ICD-10-CM | POA: Diagnosis not present

## 2022-06-08 DIAGNOSIS — F429 Obsessive-compulsive disorder, unspecified: Secondary | ICD-10-CM | POA: Diagnosis not present

## 2022-06-14 DIAGNOSIS — E7800 Pure hypercholesterolemia, unspecified: Secondary | ICD-10-CM | POA: Diagnosis not present

## 2022-06-15 DIAGNOSIS — L568 Other specified acute skin changes due to ultraviolet radiation: Secondary | ICD-10-CM | POA: Diagnosis not present

## 2022-06-15 DIAGNOSIS — Z1283 Encounter for screening for malignant neoplasm of skin: Secondary | ICD-10-CM | POA: Diagnosis not present

## 2022-06-15 DIAGNOSIS — L80 Vitiligo: Secondary | ICD-10-CM | POA: Diagnosis not present

## 2022-06-15 DIAGNOSIS — L821 Other seborrheic keratosis: Secondary | ICD-10-CM | POA: Diagnosis not present

## 2022-06-21 DIAGNOSIS — Z0000 Encounter for general adult medical examination without abnormal findings: Secondary | ICD-10-CM | POA: Diagnosis not present

## 2022-06-21 DIAGNOSIS — Z23 Encounter for immunization: Secondary | ICD-10-CM | POA: Diagnosis not present

## 2022-06-21 DIAGNOSIS — E7800 Pure hypercholesterolemia, unspecified: Secondary | ICD-10-CM | POA: Diagnosis not present

## 2022-06-21 DIAGNOSIS — F4321 Adjustment disorder with depressed mood: Secondary | ICD-10-CM | POA: Diagnosis not present

## 2022-06-21 DIAGNOSIS — F429 Obsessive-compulsive disorder, unspecified: Secondary | ICD-10-CM | POA: Diagnosis not present

## 2022-06-21 DIAGNOSIS — K219 Gastro-esophageal reflux disease without esophagitis: Secondary | ICD-10-CM | POA: Diagnosis not present

## 2022-06-21 DIAGNOSIS — M8580 Other specified disorders of bone density and structure, unspecified site: Secondary | ICD-10-CM | POA: Diagnosis not present

## 2022-06-21 DIAGNOSIS — G4700 Insomnia, unspecified: Secondary | ICD-10-CM | POA: Diagnosis not present

## 2022-06-21 DIAGNOSIS — E559 Vitamin D deficiency, unspecified: Secondary | ICD-10-CM | POA: Diagnosis not present

## 2022-06-21 DIAGNOSIS — Z79899 Other long term (current) drug therapy: Secondary | ICD-10-CM | POA: Diagnosis not present

## 2022-07-07 DIAGNOSIS — F429 Obsessive-compulsive disorder, unspecified: Secondary | ICD-10-CM | POA: Diagnosis not present

## 2022-07-15 DIAGNOSIS — F429 Obsessive-compulsive disorder, unspecified: Secondary | ICD-10-CM | POA: Diagnosis not present

## 2022-07-15 DIAGNOSIS — F4323 Adjustment disorder with mixed anxiety and depressed mood: Secondary | ICD-10-CM | POA: Diagnosis not present

## 2022-08-04 DIAGNOSIS — F429 Obsessive-compulsive disorder, unspecified: Secondary | ICD-10-CM | POA: Diagnosis not present

## 2022-08-04 DIAGNOSIS — F4323 Adjustment disorder with mixed anxiety and depressed mood: Secondary | ICD-10-CM | POA: Diagnosis not present

## 2022-08-16 DIAGNOSIS — H6123 Impacted cerumen, bilateral: Secondary | ICD-10-CM | POA: Diagnosis not present

## 2022-09-08 DIAGNOSIS — F320 Major depressive disorder, single episode, mild: Secondary | ICD-10-CM | POA: Diagnosis not present

## 2022-09-29 DIAGNOSIS — H43813 Vitreous degeneration, bilateral: Secondary | ICD-10-CM | POA: Diagnosis not present

## 2022-09-29 DIAGNOSIS — Z961 Presence of intraocular lens: Secondary | ICD-10-CM | POA: Diagnosis not present

## 2022-09-29 DIAGNOSIS — H353131 Nonexudative age-related macular degeneration, bilateral, early dry stage: Secondary | ICD-10-CM | POA: Diagnosis not present

## 2022-09-29 DIAGNOSIS — H3554 Dystrophies primarily involving the retinal pigment epithelium: Secondary | ICD-10-CM | POA: Diagnosis not present

## 2022-10-04 DIAGNOSIS — F320 Major depressive disorder, single episode, mild: Secondary | ICD-10-CM | POA: Diagnosis not present

## 2022-10-04 DIAGNOSIS — F429 Obsessive-compulsive disorder, unspecified: Secondary | ICD-10-CM | POA: Diagnosis not present

## 2022-10-20 DIAGNOSIS — M545 Low back pain, unspecified: Secondary | ICD-10-CM | POA: Diagnosis not present

## 2022-10-20 DIAGNOSIS — F429 Obsessive-compulsive disorder, unspecified: Secondary | ICD-10-CM | POA: Diagnosis not present

## 2022-10-20 DIAGNOSIS — F419 Anxiety disorder, unspecified: Secondary | ICD-10-CM | POA: Diagnosis not present

## 2022-10-20 DIAGNOSIS — F4323 Adjustment disorder with mixed anxiety and depressed mood: Secondary | ICD-10-CM | POA: Diagnosis not present

## 2022-10-25 ENCOUNTER — Ambulatory Visit (INDEPENDENT_AMBULATORY_CARE_PROVIDER_SITE_OTHER): Payer: MEDICARE

## 2022-10-25 ENCOUNTER — Encounter: Payer: Self-pay | Admitting: Family Medicine

## 2022-10-25 ENCOUNTER — Ambulatory Visit: Payer: MEDICARE | Admitting: Family Medicine

## 2022-10-25 VITALS — BP 138/82 | HR 76 | Ht 63.0 in | Wt 144.0 lb

## 2022-10-25 DIAGNOSIS — M25552 Pain in left hip: Secondary | ICD-10-CM | POA: Diagnosis not present

## 2022-10-25 DIAGNOSIS — M545 Low back pain, unspecified: Secondary | ICD-10-CM

## 2022-10-25 DIAGNOSIS — G8929 Other chronic pain: Secondary | ICD-10-CM | POA: Diagnosis not present

## 2022-10-25 NOTE — Progress Notes (Signed)
I, Stevenson Clinch, CMA acting as a scribe for [name], MD.  Vicki Quinn is a 77 y.o. female who presents to [facility] today for LBP x 2-3 months. Pt locates pain to left-side lower back radiating into the left hip and groin. Sx are intermittent. Short-lived sharp stabbing pain. Dr. Renne Crigler thinks likely MSK related, pt has concerns about cancer d/t several people around her recently being dx.    Radiating pain: glutes/groin/hip LE numbness/tingling: no LE weakness: no Aggravates: bending at the waist to pick something up.  Treatments tried: non, sx are short-lived  Pertinent review of systems: No fevers or chills  Relevant historical information: Elevated cholesterol   Exam:  BP 138/82   Pulse 76   Ht 5\' 3"  (1.6 m)   Wt 144 lb (65.3 kg)   SpO2 96%   BMI 25.51 kg/m  General: Well Developed, well nourished, and in no acute distress.   MSK: L-spine: Normal appearing Nontender palpation spinal midline. Tender palpation paraspinal musculature.  Left hip normal-appearing Tender palpation minimally greater trochanter.  Hip abduction and external rotation strength mildly diminished 4/5.     Lab and Radiology Results  X-ray images lumbar spine and left hip obtained today personally and independently interpreted  Lumbar spine: Multilevel DDD and mild degenerative scoliosis present.  No acute fractures are visible.  Left hip: Mild left hip arthritis.  No acute fractures are visible.  Await formal radiology review    Assessment and Plan: 77 y.o. female with low back pain extending to the hip.  Pain thought to be multifactorial.  I think she has back muscle spasm and dysfunction and hip abductor tendinopathy and a little bit of hip arthritis.  She is a good candidate for trial of physical therapy.  Plan to refer to PT.  Recheck in 6 to 8 weeks.   PDMP not reviewed this encounter. Orders Placed This Encounter  Procedures   DG Lumbar  Spine 2-3 Views    Standing Status:   Future    Number of Occurrences:   1    Standing Expiration Date:   11/25/2022    Order Specific Question:   Reason for Exam (SYMPTOM  OR DIAGNOSIS REQUIRED)    Answer:   low back / left hip pain radiating into the groin    Order Specific Question:   Preferred imaging location?    Answer:   [facility]   DG HIP UNILAT W OR W/O PELVIS 2-3 VIEWS LEFT    Standing Status:   Future    Number of Occurrences:   1    Standing Expiration Date:   11/25/2022    Order Specific Question:   Reason for Exam (SYMPTOM  OR DIAGNOSIS REQUIRED)    Answer:   low back / left hip pain radiating into the groin    Order Specific Question:   Preferred imaging location?    Answer:   [facility]   Ambulatory referral to Physical Therapy    Referral Priority:   Routine    Referral Type:   Physical Medicine    Referral Reason:   Specialty Services Required    Requested Specialty:   Physical Therapy    Number of Visits Requested:   1   No orders of the defined types were placed in this encounter.    Discussed warning signs or symptoms. Please see discharge instructions. Patient expresses understanding.   The above documentation has been reviewed and  is accurate and complete [name], M.D.

## 2022-10-25 NOTE — Patient Instructions (Signed)
Thank you for coming in today.   Please get an Xray today before you leave   I've referred you to Physical Therapy.  Let us know if you don't hear from them in one week.   Recheck in 6-8 weeks  Please let me know how this goes or if you have any problems in the way.

## 2022-11-01 ENCOUNTER — Other Ambulatory Visit: Payer: Self-pay

## 2022-11-01 ENCOUNTER — Ambulatory Visit (INDEPENDENT_AMBULATORY_CARE_PROVIDER_SITE_OTHER): Payer: MEDICARE | Admitting: Rehabilitative and Restorative Service Providers"

## 2022-11-01 ENCOUNTER — Telehealth: Payer: Self-pay | Admitting: Family Medicine

## 2022-11-01 ENCOUNTER — Encounter: Payer: Self-pay | Admitting: Rehabilitative and Restorative Service Providers"

## 2022-11-01 DIAGNOSIS — S32010A Wedge compression fracture of first lumbar vertebra, initial encounter for closed fracture: Secondary | ICD-10-CM

## 2022-11-01 DIAGNOSIS — G8929 Other chronic pain: Secondary | ICD-10-CM

## 2022-11-01 DIAGNOSIS — M6281 Muscle weakness (generalized): Secondary | ICD-10-CM

## 2022-11-01 DIAGNOSIS — M25552 Pain in left hip: Secondary | ICD-10-CM

## 2022-11-01 DIAGNOSIS — M5459 Other low back pain: Secondary | ICD-10-CM | POA: Diagnosis not present

## 2022-11-01 NOTE — Progress Notes (Signed)
Left hip x-ray looks okay.

## 2022-11-01 NOTE — Therapy (Signed)
OUTPATIENT PHYSICAL THERAPY EVALUATION   Patient Name: Vicki Quinn MRN: [id number] DOB:[birth year], 77 y.o., female Today's Date: 11/01/2022  END OF SESSION:  PT End of Session - 11/01/22 1341     Visit Number 1    Number of Visits 20    Date for PT Re-Evaluation 01/10/23    Authorization Type Medicare    Progress Note Due on Visit 10    PT Start Time 1349    PT Stop Time 1426    PT Time Calculation (min) 37 min    Activity Tolerance Patient tolerated treatment well    Behavior During Therapy [facility] for tasks assessed/performed             Past Medical History:  Diagnosis Date   Acid reflux    Elevated cholesterol    Fibroid    HSV-1 infection    Osteopenia 11/2017   T score -2.1 FRAX 13% / 3.3%   Pneumonia 2013   Past Surgical History:  Procedure Laterality Date   BREAST SURGERY     Left breast cyst-Benign   mole excised     Benign   TONSILLECTOMY     VAGINAL HYSTERECTOMY     Patient Active Problem List   Diagnosis Date Noted   Pain in lateral portion of right knee 10/22/2013   Elevated cholesterol    HSV-1 infection     PCP: Merri Brunette MD  REFERRING PROVIDER: Rodolph Bong, MD  REFERRING DIAG: [id number] (ICD-10-CM) - Left hip pain M54.50,G89.29 (ICD-10-CM) - Chronic left-sided low back pain without sciatica  Rationale for Evaluation and Treatment: Rehabilitation  THERAPY DIAG:  Other low back pain  Pain in left hip  Muscle weakness (generalized)  ONSET DATE: 07/2022  SUBJECTIVE:                                                                                                                                                                                           SUBJECTIVE STATEMENT: Complaints of onset of symptoms approx. 3 months ago.  Pt indicated intermittent complaints with bending over and "certain" positions.  Pt indicated wanting to help symptom to keep from getting worse.   Insidious onset.  Denied trouble sleeping because of symptoms.   Reported no specific restriction with walking. Pt indicated able to do all activity.  Some trouble going down stairs.   PERTINENT HISTORY:  Osteopenia.  MRI was ordered today 11/01/2022.  History of Rt knee   PAIN:  NPRS scale: pain at worse 3/10 Pain location: back, Lt hip Pain description: sharp at times Aggravating factors: bending,  Relieving factors: no specific actions  PRECAUTIONS: None  WEIGHT BEARING RESTRICTIONS: No  FALLS:  Has patient fallen in last 6 months? 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   PATIENT GOALS: Reduce pain/ stop from getting worse.    OBJECTIVE:   DIAGNOSTIC FINDINGS:  11/01/2022 X-ray images lumbar spine and left hip obtained today personally and independently interpreted   Lumbar spine: Multilevel DDD and mild degenerative scoliosis present.  No acute fractures are visible.   Left hip: Mild left hip arthritis.  No acute fractures are visible.    PATIENT SURVEYS:  11/01/2022 FOTO eval:   84 predicted:  86  SCREENING FOR RED FLAGS: 11/01/2022 Bowel or bladder incontinence: No Cauda equina syndrome: No  COGNITION: 11/01/2022 Overall cognitive status: WFL normal      SENSATION: 11/01/2022 [facility]  MUSCLE LENGTH: 11/01/2022 No specific testing  POSTURE:  11/01/2022 Unremarkable   PALPATION: 11/01/2022 Unremarkable   LUMBAR ROM:   AROM 11/01/2022  Flexion Mid shin with Lt lumbar pain at end range  Extension 75 % WFL   X 3 in standing : improved to 100 %   Right lateral flexion To knee joint with pulling Lt  Left lateral flexion To knee joint   Right rotation   Left rotation    (Blank rows = not tested)  LOWER EXTREMITY ROM:      Right 11/01/2022 Left 11/01/2022  Hip flexion    Hip extension    Hip abduction    Hip adduction    Hip internal rotation    Hip external  rotation    Knee flexion    Knee extension    Ankle dorsiflexion    Ankle plantarflexion    Ankle inversion    Ankle eversion     (Blank rows = not tested)  LOWER EXTREMITY MMT:    MMT Right 11/01/2022 Left 11/01/2022  Hip flexion 5/5 5/5  Hip extension    Hip abduction 4+/5 4/5  Hip adduction    Hip internal rotation    Hip external rotation    Knee flexion 5/5 5/5  Knee extension 5/5 5/5  Ankle dorsiflexion 5/5 5/5  Ankle plantarflexion    Ankle inversion    Ankle eversion     (Blank rows = not tested)  LUMBAR SPECIAL TESTS:  11/01/2022 (-) Slump bilateral  FUNCTIONAL TESTS:  11/01/2022 18 inch chair transfer:  no UE assist on 1st attempt  GAIT: 11/01/2022 Independent ambulation s deviation.  TODAY'S TREATMENT:                                                                                                         DATE: 11/01/2022  Therex:    HEP instruction/performance c cues for techniques, handout provided.  Trial set performed of each for comprehension and symptom assessment.  See below for exercise list  PATIENT EDUCATION:  11/01/2022 Education details: HEP, POC Person educated: Patient Education method: Explanation, Demonstration, Verbal cues, and Handouts Education comprehension: verbalized understanding, returned demonstration, and verbal cues required  HOME EXERCISE PROGRAM: Access Code: [email or web address] URL: [email or web address] Date: 11/01/2022 Prepared by: Chyrel Masson  Exercises - Standing Lumbar Extension with Counter  - 3-5 x daily - 7 x weekly - 1 sets - 5-10 reps - Supine Lower Trunk Rotation  - 2-3 x daily - 7 x weekly - 1 sets - 3-5 reps - 15 hold - Supine Bridge  - 1-2 x daily - 7 x weekly - 1-2 sets - 10 reps - 2 hold - Sit to  Stand  - 3 x daily - 7 x weekly - 1 sets - 10 reps - Standing Hip Hiking  - 1-2 x daily - 7 x weekly - 1 sets - 10 reps - 5 hold - Seated Straight Leg Heel Taps  - 1-2 x daily - 7 x weekly - 2-3 sets - 8-15 reps  ASSESSMENT:  CLINICAL IMPRESSION: Patient is a 77 y.o. who comes to clinic with complaints of low back, Lt hip pain with mobility, strength and movement coordination deficits that impair their ability to perform usual daily and recreational functional activities without increase difficulty/symptoms at this time.  Patient to benefit from skilled PT services to address impairments and limitations to improve to previous level of function without restriction secondary to condition.   OBJECTIVE IMPAIRMENTS: decreased activity tolerance, decreased endurance, decreased mobility, decreased ROM, hypomobility, increased fascial restrictions, impaired perceived functional ability, impaired flexibility, improper body mechanics, and pain.   ACTIVITY LIMITATIONS: bending, sitting, standing, and locomotion level  PARTICIPATION LIMITATIONS: meal prep, cleaning, laundry, interpersonal relationship, and community activity  PERSONAL FACTORS: Unremarkable are also affecting patient's functional outcome.   REHAB POTENTIAL: Good  CLINICAL DECISION MAKING: Stable/uncomplicated  EVALUATION COMPLEXITY: Low   GOALS: Goals reviewed with patient? Yes  SHORT TERM GOALS: (target date for Short term goals are 3 weeks 11/22/2022)  1. Patient will demonstrate independent use of home exercise program to maintain progress from in clinic treatments.  Goal status: New  LONG TERM GOALS: (target dates for all long term goals are 10 weeks  01/10/2023 )   1. Patient will demonstrate/report pain at worst less than or equal to 2/10 to facilitate minimal limitation in daily activity secondary to pain symptoms.  Goal status: New   2. Patient will demonstrate independent use of home exercise program to facilitate  ability to maintain/progress functional gains from skilled physical therapy services.  Goal status: New   3. Patient will demonstrate FOTO outcome > or = 86 % to indicate reduced disability due to condition.  Goal status: New   4. Patient will demonstrate lumbar extension 100 % WFL s symptoms to facilitate upright standing, walking posture at PLOF s limitation.  Goal status: New   5.  Patient will demonstrate bilateral hip abduction MMT 5/5 to facilitate improved hip stability in activity.   Goal status: New    PLAN:  PT FREQUENCY: 1-2x/week  PT DURATION: 10 weeks  PLANNED INTERVENTIONS: Therapeutic exercises, Therapeutic activity, Neuro Muscular re-education, Balance training, Gait training, Patient/Family education, Joint mobilization, Stair training, DME instructions, Dry Needling, Electrical stimulation, Cryotherapy, vasopneumatic device, Moist heat, Taping, Traction Ultrasound, Ionotophoresis 4mg /ml Dexamethasone, and aquatic therapy, Manual therapy.  All included unless contraindicated  PLAN FOR NEXT SESSION: Review HEP knowledge/results.   Progressive back/hip mobility.    Chyrel Masson, PT, DPT, OCS, [name] 11/01/22  2:47 PM

## 2022-11-01 NOTE — Progress Notes (Signed)
Lumbar spine x-ray shows arthritis and what looks like a compression fracture at the L1 vertebrae. Looks like it might be old.  We cannot tell for sure.  I will investigate this with an MRI.  You should hear about scheduling soon.

## 2022-11-01 NOTE — Telephone Encounter (Signed)
MRI ordered

## 2022-11-08 ENCOUNTER — Other Ambulatory Visit: Payer: MEDICARE

## 2022-11-08 DIAGNOSIS — M5136 Other intervertebral disc degeneration, lumbar region: Secondary | ICD-10-CM | POA: Diagnosis not present

## 2022-11-08 DIAGNOSIS — S32010A Wedge compression fracture of first lumbar vertebra, initial encounter for closed fracture: Secondary | ICD-10-CM

## 2022-11-08 DIAGNOSIS — M47816 Spondylosis without myelopathy or radiculopathy, lumbar region: Secondary | ICD-10-CM | POA: Diagnosis not present

## 2022-11-08 DIAGNOSIS — M5126 Other intervertebral disc displacement, lumbar region: Secondary | ICD-10-CM | POA: Diagnosis not present

## 2022-11-08 DIAGNOSIS — M545 Low back pain, unspecified: Secondary | ICD-10-CM

## 2022-11-09 DIAGNOSIS — F320 Major depressive disorder, single episode, mild: Secondary | ICD-10-CM | POA: Diagnosis not present

## 2022-11-12 ENCOUNTER — Ambulatory Visit (INDEPENDENT_AMBULATORY_CARE_PROVIDER_SITE_OTHER): Payer: BLUE CROSS/BLUE SHIELD | Admitting: Rehabilitative and Restorative Service Providers"

## 2022-11-12 ENCOUNTER — Encounter: Payer: Self-pay | Admitting: Rehabilitative and Restorative Service Providers"

## 2022-11-12 DIAGNOSIS — M6281 Muscle weakness (generalized): Secondary | ICD-10-CM

## 2022-11-12 DIAGNOSIS — M25552 Pain in left hip: Secondary | ICD-10-CM

## 2022-11-12 DIAGNOSIS — M5459 Other low back pain: Secondary | ICD-10-CM

## 2022-11-12 NOTE — Therapy (Signed)
OUTPATIENT PHYSICAL THERAPY TREATMENT   Patient Name: Vicki Quinn MRN: [id number] DOB:Oct 06, 1945, 77 y.o., female Today's Date: 11/12/2022  END OF SESSION:  PT End of Session - 11/12/22 1152     Visit Number 2    Number of Visits 20    Date for PT Re-Evaluation 01/10/23    Authorization Type Medicare    Progress Note Due on Visit 10    PT Start Time 1146    PT Stop Time 1225    PT Time Calculation (min) 39 min    Activity Tolerance Patient tolerated treatment well    Behavior During Therapy [facility] for tasks assessed/performed              Past Medical History:  Diagnosis Date   Acid reflux    Elevated cholesterol    Fibroid    HSV-1 infection    Osteopenia 11/2017   T score -2.1 FRAX 13% / 3.3%   Pneumonia 2013   Past Surgical History:  Procedure Laterality Date   BREAST SURGERY     Left breast cyst-Benign   mole excised     Benign   TONSILLECTOMY     VAGINAL HYSTERECTOMY     Patient Active Problem List   Diagnosis Date Noted   Pain in lateral portion of right knee 10/22/2013   Elevated cholesterol    HSV-1 infection     PCP: Merri Brunette MD  REFERRING PROVIDER: Rodolph Bong, MD  REFERRING DIAG: [phone] (ICD-10-CM) - Left hip pain M54.50,G89.29 (ICD-10-CM) - Chronic left-sided low back pain without sciatica  Rationale for Evaluation and Treatment: Rehabilitation  THERAPY DIAG:  Other low back pain  Pain in left hip  Muscle weakness (generalized)  ONSET DATE: 07/2022  SUBJECTIVE:                                                                                                                                                                                           SUBJECTIVE STATEMENT: She reported about the same upon arrival today.  She reported doing some exercise but not at the frequency desired.    PERTINENT HISTORY:  Osteopenia.  MRI was ordered today 11/01/2022.  History of Rt knee   PAIN:  NPRS scale: no specific pain upon arrival.   Pain location: back, Lt hip Pain description: sharp at times Aggravating factors: bending,  Relieving factors: no specific actions  PRECAUTIONS: None  WEIGHT BEARING RESTRICTIONS: No  FALLS:  Has patient fallen in last 6 months? 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  PATIENT GOALS: Reduce pain/ stop from getting worse.    OBJECTIVE:   DIAGNOSTIC FINDINGS:  11/01/2022 X-ray images lumbar spine and left hip obtained today personally and independently interpreted   Lumbar spine: Multilevel DDD and mild degenerative scoliosis present.  No acute fractures are visible.   Left hip: Mild left hip arthritis.  No acute fractures are visible.    PATIENT SURVEYS:  11/01/2022 FOTO eval:   84 predicted:  86  SCREENING FOR RED FLAGS: 11/01/2022 Bowel or bladder incontinence: No Cauda equina syndrome: No  COGNITION: 11/01/2022 Overall cognitive status: WFL normal      SENSATION: 11/01/2022 [facility]  MUSCLE LENGTH: 11/01/2022 No specific testing  POSTURE:  11/01/2022 Unremarkable   PALPATION: 11/01/2022 Unremarkable   LUMBAR ROM:   AROM 11/01/2022 11/12/2022  Flexion Mid shin with Lt lumbar pain at end range   Extension 75 % WFL   X 3 in standing : improved to 100 %    Right lateral flexion To knee joint with pulling Lt   Left lateral flexion To knee joint    Right rotation    Left rotation     (Blank rows = not tested)  LOWER EXTREMITY ROM:      Right 11/01/2022 Left 11/01/2022  Hip flexion    Hip extension    Hip abduction    Hip adduction    Hip internal rotation    Hip external rotation    Knee flexion    Knee extension    Ankle dorsiflexion    Ankle plantarflexion    Ankle inversion    Ankle eversion     (Blank rows = not tested)  LOWER EXTREMITY MMT:    MMT Right 11/01/2022 Left 11/01/2022  Hip flexion  5/5 5/5  Hip extension    Hip abduction 4+/5 4/5  Hip adduction    Hip internal rotation    Hip external rotation    Knee flexion 5/5 5/5  Knee extension 5/5 5/5  Ankle dorsiflexion 5/5 5/5  Ankle plantarflexion    Ankle inversion    Ankle eversion     (Blank rows = not tested)  LUMBAR SPECIAL TESTS:  11/01/2022 (-) Slump bilateral  FUNCTIONAL TESTS:  11/01/2022 18 inch chair transfer:  no UE assist on 1st attempt  GAIT: 11/01/2022 Independent ambulation s deviation.  TODAY'S TREATMENT:                                                                                                         DATE: 11/12/2022  Therex: Supine lumbar trunk rotation 15 sec hold x 3 bilateral Supine bridge 2 x 10  Standing in doorway hip hike 3 sec hold x 10 bilateral  Verbal review of existing HEP.  Education given on performance of activity to pain tolerance and also discussed pain monitoring  Nustep Lvl 5 8 mins UE/LE  - had to adjust back further in seat to avoid Rt knee complaints.  Supine green band single leg clam shell x 20, performed bilateral Sit to stand from 18 inch chair with foam pad x 10   TODAY'S TREATMENT:                                                                                                         DATE: 11/01/2022  Therex:    HEP instruction/performance c cues for techniques, handout provided.  Trial set performed of each for comprehension and symptom assessment.  See below for exercise list  PATIENT EDUCATION:  11/01/2022 Education details: HEP, POC Person educated: Patient Education method: Explanation, Demonstration, Verbal cues, and Handouts Education comprehension: verbalized understanding, returned demonstration, and verbal cues required  HOME EXERCISE  PROGRAM: Access Code: [email or web address] URL: [email or web address] Date: 11/01/2022 Prepared by: Chyrel Masson  Exercises - Standing Lumbar Extension with Counter  - 3-5 x daily - 7 x weekly - 1 sets - 5-10 reps - Supine Lower Trunk Rotation  - 2-3 x daily - 7 x weekly - 1 sets - 3-5 reps - 15 hold - Supine Bridge  - 1-2 x daily - 7 x weekly - 1-2 sets - 10 reps - 2 hold - Sit to Stand  - 3 x daily - 7 x weekly - 1 sets - 10 reps - Standing Hip Hiking  - 1-2 x daily - 7 x weekly - 1 sets - 10 reps - 5 hold - Seated Straight Leg Heel Taps  - 1-2 x daily - 7 x weekly - 2-3 sets - 8-15 reps  ASSESSMENT:  CLINICAL IMPRESSION: Some adjustment of intervention required to avoid aggravating Rt knee complaints.  Some cues required to improve techniques of existing HEP.  Pt to benefit from continued skilled PT services c mobility/strength focus.     OBJECTIVE IMPAIRMENTS: decreased activity tolerance, decreased endurance, decreased mobility, decreased ROM, hypomobility, increased fascial restrictions, impaired perceived functional ability, impaired flexibility, improper body mechanics, and pain.   ACTIVITY LIMITATIONS: bending, sitting, standing, and locomotion level  PARTICIPATION LIMITATIONS: meal  prep, cleaning, laundry, interpersonal relationship, and community activity  PERSONAL FACTORS: Unremarkable are also affecting patient's functional outcome.   REHAB POTENTIAL: Good  CLINICAL DECISION MAKING: Stable/uncomplicated  EVALUATION COMPLEXITY: Low   GOALS: Goals reviewed with patient? Yes  SHORT TERM GOALS: (target date for Short term goals are 3 weeks 11/22/2022)  1. Patient will demonstrate independent use of home exercise program to maintain progress from in clinic treatments.  Goal status: New  LONG TERM GOALS: (target dates for all long term goals are 10 weeks  01/10/2023 )   1. Patient will demonstrate/report pain at worst less than or equal to 2/10 to facilitate  minimal limitation in daily activity secondary to pain symptoms.  Goal status: New   2. Patient will demonstrate independent use of home exercise program to facilitate ability to maintain/progress functional gains from skilled physical therapy services.  Goal status: New   3. Patient will demonstrate FOTO outcome > or = 86 % to indicate reduced disability due to condition.  Goal status: New   4. Patient will demonstrate lumbar extension 100 % WFL s symptoms to facilitate upright standing, walking posture at PLOF s limitation.  Goal status: New   5.  Patient will demonstrate bilateral hip abduction MMT 5/5 to facilitate improved hip stability in activity.   Goal status: New    PLAN:  PT FREQUENCY: 1-2x/week  PT DURATION: 10 weeks  PLANNED INTERVENTIONS: Therapeutic exercises, Therapeutic activity, Neuro Muscular re-education, Balance training, Gait training, Patient/Family education, Joint mobilization, Stair training, DME instructions, Dry Needling, Electrical stimulation, Cryotherapy, vasopneumatic device, Moist heat, Taping, Traction Ultrasound, Ionotophoresis 4mg /ml Dexamethasone, and aquatic therapy, Manual therapy.  All included unless contraindicated  PLAN FOR NEXT SESSION: Review HEP knowledge/results.   Progressive back/hip mobility.    Chyrel Masson, PT, DPT, OCS, [name] 11/12/22  12:24 PM

## 2022-11-18 ENCOUNTER — Encounter: Payer: MEDICARE | Admitting: Rehabilitative and Restorative Service Providers"

## 2022-11-19 NOTE — Progress Notes (Signed)
MRI shows old compression fractures which are completely healed and should not be causing pain.  You do have areas where nerves could be pinched or other things that could produce pain in the back.  Recommend proceeding to physical therapy and check back as scheduled on the 26th.  Will talk about this MRI in full detail and what we are going to do about it.  I do think physical therapy is a good idea.

## 2022-11-23 ENCOUNTER — Encounter: Payer: Self-pay | Admitting: Rehabilitative and Restorative Service Providers"

## 2022-11-23 ENCOUNTER — Ambulatory Visit (INDEPENDENT_AMBULATORY_CARE_PROVIDER_SITE_OTHER): Payer: MEDICARE | Admitting: Rehabilitative and Restorative Service Providers"

## 2022-11-23 DIAGNOSIS — M5459 Other low back pain: Secondary | ICD-10-CM

## 2022-11-23 DIAGNOSIS — M25552 Pain in left hip: Secondary | ICD-10-CM | POA: Diagnosis not present

## 2022-11-23 DIAGNOSIS — M6281 Muscle weakness (generalized): Secondary | ICD-10-CM

## 2022-11-23 NOTE — Therapy (Addendum)
OUTPATIENT PHYSICAL THERAPY TREATMENT  / DISCHARGE   Patient Name: Vicki Quinn MRN: [id number] DOB:1945/07/13, 77 y.o., female Today's Date: 11/23/2022  END OF SESSION:  PT End of Session - 11/23/22 1053     Visit Number 3    Number of Visits 20    Date for PT Re-Evaluation 01/10/23    Authorization Type Medicare    Progress Note Due on Visit 10    PT Start Time 1053    PT Stop Time 1118    PT Time Calculation (min) 25 min    Activity Tolerance Patient tolerated treatment well    Behavior During Therapy [facility] for tasks assessed/performed               Past Medical History:  Diagnosis Date   Acid reflux    Elevated cholesterol    Fibroid    HSV-1 infection    Osteopenia 11/2017   T score -2.1 FRAX 13% / 3.3%   Pneumonia 2013   Past Surgical History:  Procedure Laterality Date   BREAST SURGERY     Left breast cyst-Benign   mole excised     Benign   TONSILLECTOMY     VAGINAL HYSTERECTOMY     Patient Active Problem List   Diagnosis Date Noted   Pain in lateral portion of right knee 10/22/2013   Elevated cholesterol    HSV-1 infection     PCP: Merri Brunette MD  REFERRING PROVIDER: Rodolph Bong, MD  REFERRING DIAG: [phone] (ICD-10-CM) - Left hip pain M54.50,G89.29 (ICD-10-CM) - Chronic left-sided low back pain without sciatica  Rationale for Evaluation and Treatment: Rehabilitation  THERAPY DIAG:  Other low back pain  Pain in left hip  Muscle weakness (generalized)  ONSET DATE: 07/2022  SUBJECTIVE:                                                                                                                                                                                           SUBJECTIVE STATEMENT: Pt indicated she got "much better" at during exercise since last visit.   Reported not having sharp pain.   PERTINENT HISTORY:  Osteopenia.  MRI was ordered today 11/01/2022.  History of Rt knee   PAIN:  NPRS scale: at worst 1/10.   Pain  location: back, Lt hip Pain description: sharp at times Aggravating factors: bending,  Relieving factors: no specific actions  PRECAUTIONS: None  WEIGHT BEARING RESTRICTIONS: No  FALLS:  Has patient fallen in last 6 months? No  LIVING ENVIRONMENT: Lives in: House/apartment Denied stairs  OCCUPATION: Retired   PLOF: Independent, Silver sneakers exercise class, Art therapist, social  events.   PATIENT GOALS: Reduce pain/ stop from getting worse.    OBJECTIVE:   DIAGNOSTIC FINDINGS:  11/01/2022 X-ray images lumbar spine and left hip obtained today personally and independently interpreted   Lumbar spine: Multilevel DDD and mild degenerative scoliosis present.  No acute fractures are visible.   Left hip: Mild left hip arthritis.  No acute fractures are visible.    PATIENT SURVEYS:  11/23/2022:  FOTO update:  97  11/01/2022 FOTO eval:   84 predicted:  86  SCREENING FOR RED FLAGS: 11/01/2022 Bowel or bladder incontinence: No Cauda equina syndrome: No  COGNITION: 11/01/2022 Overall cognitive status: WFL normal      SENSATION: 11/01/2022 [facility]  MUSCLE LENGTH: 11/01/2022 No specific testing  POSTURE:  11/01/2022 Unremarkable   PALPATION: 11/01/2022 Unremarkable   LUMBAR ROM:   AROM 11/01/2022 11/23/2022  Flexion Mid shin with Lt lumbar pain at end range To toes without pain  Extension 75 % WFL   X 3 in standing : improved to 100 %  100 % WFL  Right lateral flexion To knee joint with pulling Lt   Left lateral flexion To knee joint    Right rotation    Left rotation     (Blank rows = not tested)  LOWER EXTREMITY ROM:      Right 11/01/2022 Left 11/01/2022  Hip flexion    Hip extension    Hip abduction    Hip adduction    Hip internal rotation    Hip external rotation    Knee flexion    Knee extension    Ankle dorsiflexion    Ankle plantarflexion    Ankle inversion    Ankle eversion     (Blank rows = not tested)  LOWER EXTREMITY MMT:     MMT Right 11/01/2022 Left 11/01/2022 Right 11/23/2022 Left 11/23/2022  Hip flexion 5/5 5/5    Hip extension      Hip abduction 4+/5 4/5 4+/5 5/5  Hip adduction      Hip internal rotation      Hip external rotation      Knee flexion 5/5 5/5    Knee extension 5/5 5/5    Ankle dorsiflexion 5/5 5/5    Ankle plantarflexion      Ankle inversion      Ankle eversion       (Blank rows = not tested)  LUMBAR SPECIAL TESTS:  11/01/2022 (-) Slump bilateral  FUNCTIONAL TESTS:  11/01/2022 18 inch chair transfer:  no UE assist on 1st attempt  GAIT: 11/01/2022 Independent ambulation s deviation.  TODAY'S TREATMENT:                                                                                                         DATE: 11/23/2022  Therex: Standing lumbar extension AROM x 5  Verbal instruction of HEP and review of handout for upcoming trial HEP.   Additional time spent in review and adjustment of techniques according to pain complaints.    Review of continued participation in gym based silver sneakers activity to promote continued movement.   TODAY'S TREATMENT:                                                                                                         DATE: 11/12/2022  Therex: Supine lumbar trunk rotation 15 sec hold x 3 bilateral Supine bridge 2 x 10  Standing in doorway hip hike 3 sec hold x 10 bilateral  Verbal review of existing HEP.  Education given on performance of activity to pain tolerance and also discussed pain monitoring  Nustep Lvl 5 8 mins UE/LE  - had to adjust back further in seat to avoid Rt knee complaints.  Supine green band single leg clam shell x 20, performed bilateral Sit to stand from 18 inch chair with foam pad x 10   TODAY'S TREATMENT:                                                                                                          DATE: 11/01/2022  Therex:    HEP instruction/performance c cues for techniques, handout provided.  Trial set performed of each for comprehension and symptom assessment.  See below for exercise list  PATIENT EDUCATION:  11/23/2022 Education details: HEP update  Person educated: Patient Education method: Explanation, Demonstration, Verbal cues, and Handouts Education comprehension: verbalized understanding, returned demonstration, and verbal cues required  HOME EXERCISE PROGRAM: Access Code: [email or web address] URL: [email or web address] Date: 11/23/2022 Prepared by: Chyrel Masson  Exercises - Standing Lumbar Extension with Counter  - 3-5 x daily - 7 x weekly - 1 sets - 5-10 reps - Supine Lower Trunk Rotation  - 2-3 x daily - 7 x weekly - 1 sets - 3-5 reps - 15 hold -  Supine Bridge  - 1-2 x daily - 7 x weekly - 1-2 sets - 10 reps - 2 hold - Clamshell  - 1 x daily - 7 x weekly - 3 sets - 10 reps - Sit to Stand  - 3 x daily - 7 x weekly - 1 sets - 10 reps - Standing Hip Hiking  - 1-2 x daily - 7 x weekly - 1 sets - 10 reps - 5 hold - Seated Straight Leg Heel Taps  - 1-2 x daily - 7 x weekly - 2-3 sets - 8-15 reps  ASSESSMENT:  CLINICAL IMPRESSION: At this time, Pt recommended for trial HEP due to improvement in range and strength as well as FOTO reassessment showing improvement.  Pt was in agreement with plan at this time.      OBJECTIVE IMPAIRMENTS: decreased activity tolerance, decreased endurance, decreased mobility, decreased ROM, hypomobility, increased fascial restrictions, impaired perceived functional ability, impaired flexibility, improper body mechanics, and pain.   ACTIVITY LIMITATIONS: bending, sitting, standing, and locomotion level  PARTICIPATION LIMITATIONS: meal prep, cleaning, laundry, interpersonal relationship, and community activity  PERSONAL FACTORS: Unremarkable  are also affecting patient's functional outcome.   REHAB POTENTIAL: Good  CLINICAL DECISION MAKING: Stable/uncomplicated  EVALUATION COMPLEXITY: Low   GOALS: Goals reviewed with patient? Yes  SHORT TERM GOALS: (target date for Short term goals are 3 weeks 11/22/2022)  1. Patient will demonstrate independent use of home exercise program to maintain progress from in clinic treatments.  Goal status: Met   LONG TERM GOALS: (target dates for all long term goals are 10 weeks  01/10/2023 )   1. Patient will demonstrate/report pain at worst less than or equal to 2/10 to facilitate minimal limitation in daily activity secondary to pain symptoms.  Goal status: Met    2. Patient will demonstrate independent use of home exercise program to facilitate ability to maintain/progress functional gains from skilled physical therapy services.  Goal status: Met    3. Patient will demonstrate FOTO outcome > or = 86 % to indicate reduced disability due to condition.  Goal status: Met    4. Patient will demonstrate lumbar extension 100 % WFL s symptoms to facilitate upright standing, walking posture at PLOF s limitation.  Goal status: Met    5.  Patient will demonstrate bilateral hip abduction MMT 5/5 to facilitate improved hip stability in activity.   Goal status: partially met    PLAN:  PT FREQUENCY: 1-2x/week  PT DURATION: 10 weeks  PLANNED INTERVENTIONS: Therapeutic exercises, Therapeutic activity, Neuro Muscular re-education, Balance training, Gait training, Patient/Family education, Joint mobilization, Stair training, DME instructions, Dry Needling, Electrical stimulation, Cryotherapy, vasopneumatic device, Moist heat, Taping, Traction Ultrasound, Ionotophoresis 4mg /ml Dexamethasone, and aquatic therapy, Manual therapy.  All included unless contraindicated  PLAN FOR NEXT SESSION: Trial HEP period. Discharge after 30 days inactivity.     Chyrel Masson, PT, DPT, OCS, [name] 11/23/22   11:24 AM   PHYSICAL THERAPY DISCHARGE SUMMARY  Visits from Start of Care: 3  Current functional level related to goals / functional outcomes: See note   Remaining deficits: See note   Education / Equipment: HEP  Patient goals were  mostly met . Patient is being discharged due to not returning since the last visit.   Chyrel Masson, PT, DPT, OCS, [name] 01/06/23  9:13 AM

## 2022-11-25 ENCOUNTER — Encounter: Payer: MEDICARE | Admitting: Rehabilitative and Restorative Service Providers"

## 2022-12-03 NOTE — Progress Notes (Unsigned)
Vicki Payor, [name], LAT, [name] acting as a scribe for [name], MD.  Vicki Quinn is a 77 y.o. female who presents to [facility] at [facility] today for f/u L hip and low back pain w/ MRI review. Pt was last seen by Dr. Denyse Amass on 10/25/22 and she was referred to PT, completing 3 visits. Based on L-spine XR results, a MRI was ordered.   Today, pt reports only slight pain in her low back. She c/o a "twinge" to the anterior aspect of her L hip. She has been diligent about her HEP.   Dx imaging: 11/08/22 L-spine MRI  10/25/22 L-spine & L hip XR  Pertinent review of systems: No fevers or chills  Relevant historical information: Elevated cholesterol   Exam:  BP 118/76   Pulse 71   Ht 5\' 3"  (1.6 m)   Wt 144 lb (65.3 kg)   SpO2 95%   BMI 25.51 kg/m  General: Well Developed, well nourished, and in no acute distress.   MSK: L-spine nontender palpation spinal midline.  Normal lumbar motion.  Lower extremity strength is intact.    Lab and Radiology Results  MR Lumbar Spine Wo Contrast  Result Date: 11/18/2022 CLINICAL DATA:  Compression fracture, lumbar. Low back pain. Left hip and leg pain. EXAM: MRI LUMBAR SPINE WITHOUT CONTRAST TECHNIQUE: Multiplanar, multisequence MR imaging of the lumbar spine was performed. No intravenous contrast was administered. COMPARISON:  Radiography 10/25/2022 FINDINGS: Segmentation:  5 lumbar type vertebral bodies. Alignment: Mild scoliotic curvature convex to the right. No listhesis. Vertebrae: Old superior endplate fractures/Schmorl's nodes at L1 and L5. These are distant and completely healed without any residual edema. There is some degenerative endplate edema on the left at L3-4 which could contribute to regional pain. Conus medullaris and cauda equina: Conus extends to the L1 level. Conus and cauda equina appear normal. Paraspinal and other soft tissues: Negative Disc levels: Minimal non-compressive disc bulges from T11-12 through L2-3.  L3-4: Disc degeneration with endplate osteophytes and protrusion of the disc more prominent towards the left. Facet degeneration and hypertrophy worse on the left. Severe multifactorial stenosis at this level that could cause neural compression on either or both sides, more likely the left. L4-5: Bulging of the disc. Facet and ligamentous hypertrophy. Mild stenosis of both lateral recesses but without definite neural compression. L5-S1: Disc degeneration with loss of disc height. Mild bulging of the disc. Mild stenosis of the subarticular lateral recesses and neural foramina but without definite neural compression. IMPRESSION: 1. Old superior endplate fractures/Schmorl's nodes at L1 and L5. These are distant and completely healed without any residual edema. 2. L3-4: Disc degeneration with endplate osteophytes and protrusion of the disc more prominent towards the left. Facet degeneration and hypertrophy worse on the left. Severe multifactorial stenosis at this level that could cause neural compression on either or both sides, more likely the left. Discogenic endplate marrow edema on the left which could contribute to regional pain. 3. L4-5: Bulging of the disc. Facet and ligamentous hypertrophy. Mild stenosis of both lateral recesses but without definite neural compression. 4. L5-S1: Disc degeneration with loss of disc height. Mild bulging of the disc. Mild stenosis of the subarticular lateral recesses and neural foramina but without definite neural compression. Electronically Signed   By: Paulina Fusi M.D.   On: 11/18/2022 12:43   DG Lumbar Spine 2-3 Views  Result Date: 10/31/2022 CLINICAL DATA:  Low back and left hip pain radiating into groin. No known  injury. EXAM: LUMBAR SPINE - 2-3 VIEW; DG HIP (WITH OR WITHOUT PELVIS) 2-3V LEFT COMPARISON:  None Available. FINDINGS: Lumbar spine: There is a mild compression deformity in the superior endplate at L1 with loss of vertebral body height of a proximally 10%.  Alignment is normal. Mild levo scoliosis is noted. Multilevel intervertebral disc space narrowing, degenerative endplate changes, and facet arthropathy. Left hip: No acute fracture or dislocation. Joint space is maintained at the hips bilaterally. IMPRESSION: 1. Mild compression deformity in the superior endplate at L1, indeterminate in age. 2. Multilevel degenerative disc disease and facet arthropathy. 3. Normal examination of the hips. Electronically Signed   By: Thornell Sartorius M.D.   On: 10/31/2022 02:52   DG HIP UNILAT W OR W/O PELVIS 2-3 VIEWS LEFT  Result Date: 10/31/2022 CLINICAL DATA:  Low back and left hip pain radiating into groin. No known injury. EXAM: LUMBAR SPINE - 2-3 VIEW; DG HIP (WITH OR WITHOUT PELVIS) 2-3V LEFT COMPARISON:  None Available. FINDINGS: Lumbar spine: There is a mild compression deformity in the superior endplate at L1 with loss of vertebral body height of a proximally 10%. Alignment is normal. Mild levo scoliosis is noted. Multilevel intervertebral disc space narrowing, degenerative endplate changes, and facet arthropathy. Left hip: No acute fracture or dislocation. Joint space is maintained at the hips bilaterally. IMPRESSION: 1. Mild compression deformity in the superior endplate at L1, indeterminate in age. 2. Multilevel degenerative disc disease and facet arthropathy. 3. Normal examination of the hips. Electronically Signed   By: Thornell Sartorius M.D.   On: 10/31/2022 02:52   I, [name], personally (independently) visualized and performed the interpretation of the images attached in this note.      Assessment and Plan: 77 y.o. female with left low back and lateral to anterior hip pain.  I think the main issue is muscle dysfunction and weakness.  She has improved considerably with only 3 physical therapy sessions and is feeling a lot better.  Certainly we could add more physical therapy in the future if needed but she is pretty happy with how things are going right  now.  We did get an MRI sooner than we otherwise would of because the lumbar spine x-ray on July 15 showed concern for an L1 vertebral compression fracture that on subsequent MRI showed to be a Schmorl's node.  She also had a Schmorl's node at L5.  She does have areas with pretty significant neuroforaminal stenosis that could cause some lumbar radicular symptoms.  Overall she is doing pretty well and I think we can proceed with a bit of watchful waiting and continue home exercise program.  Could add epidural steroid injection in the future if needed or further physical therapy.  For now watchful waiting.  I did look at her bone density test from 2022.  Her T-score was just above -2.5 putting her just in the osteopenia range.  I encouraged her to get another bone density test in February 2025 which would be 3 years.  She is already doing weightbearing exercise.  If not still okay I would highly can encourage medication such as Prolia or Evenity.   PDMP not reviewed this encounter. No orders of the defined types were placed in this encounter.  No orders of the defined types were placed in this encounter.    Discussed warning signs or symptoms. Please see discharge instructions. Patient expresses understanding.   The above documentation has been reviewed and is accurate and complete [name], M.D.

## 2022-12-06 ENCOUNTER — Ambulatory Visit (INDEPENDENT_AMBULATORY_CARE_PROVIDER_SITE_OTHER): Payer: MEDICARE | Admitting: Family Medicine

## 2022-12-06 VITALS — BP 118/76 | HR 71 | Ht 63.0 in | Wt 144.0 lb

## 2022-12-06 DIAGNOSIS — M8588 Other specified disorders of bone density and structure, other site: Secondary | ICD-10-CM | POA: Diagnosis not present

## 2022-12-06 DIAGNOSIS — G8929 Other chronic pain: Secondary | ICD-10-CM

## 2022-12-06 DIAGNOSIS — M545 Low back pain, unspecified: Secondary | ICD-10-CM

## 2022-12-06 DIAGNOSIS — M8580 Other specified disorders of bone density and structure, unspecified site: Secondary | ICD-10-CM | POA: Insufficient documentation

## 2022-12-06 NOTE — Patient Instructions (Addendum)
Thank you for coming in today.   Keep working on your exercises at home.  If you would like to do more physical therapy, let me know.   Check back as needed

## 2022-12-07 DIAGNOSIS — F320 Major depressive disorder, single episode, mild: Secondary | ICD-10-CM | POA: Diagnosis not present

## 2022-12-15 DIAGNOSIS — Z23 Encounter for immunization: Secondary | ICD-10-CM | POA: Diagnosis not present

## 2023-01-06 DIAGNOSIS — F320 Major depressive disorder, single episode, mild: Secondary | ICD-10-CM | POA: Diagnosis not present

## 2023-01-11 DIAGNOSIS — Z1231 Encounter for screening mammogram for malignant neoplasm of breast: Secondary | ICD-10-CM | POA: Diagnosis not present

## 2023-01-20 DIAGNOSIS — K219 Gastro-esophageal reflux disease without esophagitis: Secondary | ICD-10-CM | POA: Diagnosis not present

## 2023-01-21 DIAGNOSIS — Z23 Encounter for immunization: Secondary | ICD-10-CM | POA: Diagnosis not present

## 2023-01-25 DIAGNOSIS — K219 Gastro-esophageal reflux disease without esophagitis: Secondary | ICD-10-CM | POA: Diagnosis not present

## 2023-01-25 DIAGNOSIS — K2270 Barrett's esophagus without dysplasia: Secondary | ICD-10-CM | POA: Diagnosis not present

## 2023-01-25 DIAGNOSIS — K449 Diaphragmatic hernia without obstruction or gangrene: Secondary | ICD-10-CM | POA: Diagnosis not present

## 2023-01-25 DIAGNOSIS — K317 Polyp of stomach and duodenum: Secondary | ICD-10-CM | POA: Diagnosis not present

## 2023-01-25 DIAGNOSIS — Z13810 Encounter for screening for upper gastrointestinal disorder: Secondary | ICD-10-CM | POA: Diagnosis not present

## 2023-01-25 DIAGNOSIS — K3189 Other diseases of stomach and duodenum: Secondary | ICD-10-CM | POA: Diagnosis not present

## 2023-01-27 DIAGNOSIS — F320 Major depressive disorder, single episode, mild: Secondary | ICD-10-CM | POA: Diagnosis not present

## 2023-02-28 DIAGNOSIS — F419 Anxiety disorder, unspecified: Secondary | ICD-10-CM | POA: Diagnosis not present

## 2023-02-28 DIAGNOSIS — F4323 Adjustment disorder with mixed anxiety and depressed mood: Secondary | ICD-10-CM | POA: Diagnosis not present

## 2023-02-28 DIAGNOSIS — F429 Obsessive-compulsive disorder, unspecified: Secondary | ICD-10-CM | POA: Diagnosis not present

## 2023-03-02 DIAGNOSIS — F320 Major depressive disorder, single episode, mild: Secondary | ICD-10-CM | POA: Diagnosis not present

## 2023-03-17 DIAGNOSIS — K2270 Barrett's esophagus without dysplasia: Secondary | ICD-10-CM | POA: Diagnosis not present

## 2023-03-25 DIAGNOSIS — F320 Major depressive disorder, single episode, mild: Secondary | ICD-10-CM | POA: Diagnosis not present

## 2023-05-04 DIAGNOSIS — F320 Major depressive disorder, single episode, mild: Secondary | ICD-10-CM | POA: Diagnosis not present

## 2023-06-03 DIAGNOSIS — F320 Major depressive disorder, single episode, mild: Secondary | ICD-10-CM | POA: Diagnosis not present

## 2023-06-23 DIAGNOSIS — E7800 Pure hypercholesterolemia, unspecified: Secondary | ICD-10-CM | POA: Diagnosis not present

## 2023-06-27 DIAGNOSIS — E538 Deficiency of other specified B group vitamins: Secondary | ICD-10-CM | POA: Diagnosis not present

## 2023-06-27 DIAGNOSIS — F429 Obsessive-compulsive disorder, unspecified: Secondary | ICD-10-CM | POA: Diagnosis not present

## 2023-06-27 DIAGNOSIS — R110 Nausea: Secondary | ICD-10-CM | POA: Diagnosis not present

## 2023-06-27 DIAGNOSIS — E559 Vitamin D deficiency, unspecified: Secondary | ICD-10-CM | POA: Diagnosis not present

## 2023-06-27 DIAGNOSIS — E7800 Pure hypercholesterolemia, unspecified: Secondary | ICD-10-CM | POA: Diagnosis not present

## 2023-06-27 DIAGNOSIS — K219 Gastro-esophageal reflux disease without esophagitis: Secondary | ICD-10-CM | POA: Diagnosis not present

## 2023-06-27 DIAGNOSIS — Z0000 Encounter for general adult medical examination without abnormal findings: Secondary | ICD-10-CM | POA: Diagnosis not present

## 2023-06-28 DIAGNOSIS — F429 Obsessive-compulsive disorder, unspecified: Secondary | ICD-10-CM | POA: Diagnosis not present

## 2023-06-28 DIAGNOSIS — F4323 Adjustment disorder with mixed anxiety and depressed mood: Secondary | ICD-10-CM | POA: Diagnosis not present

## 2023-06-28 DIAGNOSIS — F419 Anxiety disorder, unspecified: Secondary | ICD-10-CM | POA: Diagnosis not present

## 2023-07-20 DIAGNOSIS — L821 Other seborrheic keratosis: Secondary | ICD-10-CM | POA: Diagnosis not present

## 2023-07-20 DIAGNOSIS — D225 Melanocytic nevi of trunk: Secondary | ICD-10-CM | POA: Diagnosis not present

## 2023-07-20 DIAGNOSIS — L814 Other melanin hyperpigmentation: Secondary | ICD-10-CM | POA: Diagnosis not present

## 2023-07-21 DIAGNOSIS — F320 Major depressive disorder, single episode, mild: Secondary | ICD-10-CM | POA: Diagnosis not present

## 2023-08-17 DIAGNOSIS — E538 Deficiency of other specified B group vitamins: Secondary | ICD-10-CM | POA: Diagnosis not present

## 2023-08-17 DIAGNOSIS — E7800 Pure hypercholesterolemia, unspecified: Secondary | ICD-10-CM | POA: Diagnosis not present

## 2023-08-18 DIAGNOSIS — E7800 Pure hypercholesterolemia, unspecified: Secondary | ICD-10-CM | POA: Diagnosis not present

## 2023-08-24 ENCOUNTER — Encounter: Payer: Self-pay | Admitting: Nurse Practitioner

## 2023-08-24 ENCOUNTER — Ambulatory Visit (INDEPENDENT_AMBULATORY_CARE_PROVIDER_SITE_OTHER): Payer: MEDICARE | Admitting: Nurse Practitioner

## 2023-08-24 VITALS — BP 118/82 | HR 78 | Ht 63.5 in | Wt 143.0 lb

## 2023-08-24 DIAGNOSIS — Z01419 Encounter for gynecological examination (general) (routine) without abnormal findings: Secondary | ICD-10-CM

## 2023-08-24 DIAGNOSIS — B009 Herpesviral infection, unspecified: Secondary | ICD-10-CM

## 2023-08-24 DIAGNOSIS — M8589 Other specified disorders of bone density and structure, multiple sites: Secondary | ICD-10-CM

## 2023-08-24 DIAGNOSIS — Z780 Asymptomatic menopausal state: Secondary | ICD-10-CM

## 2023-08-24 DIAGNOSIS — Z9189 Other specified personal risk factors, not elsewhere classified: Secondary | ICD-10-CM

## 2023-08-24 NOTE — Progress Notes (Signed)
 Vicki Quinn [birth year] [id number]   History:  78 y.o. G0 presents for breast and pelvic exam. No GYN complaints. Postmenopausal - no HRT. S/P 1983 TVH for fibroids. Normal pap history. Osteopenia with increased risk of hip fracture, she is not interested in medication management or further screenings. Doing silver sneakers twice weekly. HDL, depression managed by PCP.   Gynecologic History No LMP recorded. Patient has had a hysterectomy.   Last Pap: 2012 Last mammogram: 01/2023. Results were: Normal per patient Last colonoscopy: 07/29/2014. Results were: Normal Last Dexa: 06/03/2020. Results were: T-score -2.3, FRAX 15% / 4.6%  Past medical history, past surgical history, family history and social history were all reviewed and documented in the EPIC chart. Widowed. Husband passed in 2024 from lunch cancer.   ROS:  A ROS was performed and pertinent positives and negatives are included.  Exam:  Vitals:   08/24/23 1139  BP: 118/82  Pulse: 78  SpO2: 95%  Weight: 143 lb (64.9 kg)  Height: 5' 3.5" (1.613 m)     Body mass index is 24.93 kg/m.  General appearance:  Normal Thyroid :  Symmetrical, normal in size, without palpable masses or nodularity. Respiratory  Auscultation:  Clear without wheezing or rhonchi Cardiovascular  Auscultation:  Regular rate, without rubs, murmurs or gallops  Edema/varicosities:  Not grossly evident Abdominal  Soft,nontender, without masses, guarding or rebound.  Liver/spleen:  No organomegaly noted  Hernia:  None appreciated  Skin  Inspection:  Grossly normal   Breasts: Examined lying and sitting.   Right: Without masses, retractions, discharge or axillary adenopathy.   Left: Without masses, retractions, discharge or axillary adenopathy. Pelvic: External genitalia:  no lesions              Urethra:  normal appearing urethra with no masses, tenderness or lesions              Bartholins and Skenes: normal                 Vagina: normal  appearing vagina with normal color and discharge, no lesions. Atrophic changes              Cervix: absent Bimanual Exam:  Uterus:  absent              Adnexa: no mass, fullness, tenderness              Rectovaginal: Deferred              Anus:  normal, no lesions  Patient informed chaperone available to be present for breast and pelvic exam. Patient has requested no chaperone to be present. Patient has been advised what will be completed during breast and pelvic exam.   Assessment/Plan:  78 y.o. G0 for breast and pelvic exam.    Encounter for breast and pelvic examination - Education provided on SBEs, importance of preventative screenings, current guidelines, high calcium diet, regular exercise, and multivitamin daily. Labs with PCP.   Postmenopausal - no HRT. S/P 1983 TVH for fibroids.   Osteopenia of multiple sites - T-score -2.3 with elevated risk for hip fracture at 4.6%. Not interested in medication management of further screenings. Takes Calcium and Vitamin D , doing Silver Sneakers twice a week and plans to increase.   Screening for cervical cancer - Normal Pap history.  No longer screening per guidelines.   Screening for breast cancer - Normal mammogram history.  Continue annual screenings.  Normal breast exam today.  Screening for colon cancer - 2016  colonoscopy. Will repeat at GI's recommended interval.   Return in about 2 years (around 08/23/2025) for B&P.    Andee Bamberger [facility], 11:56 AM 08/24/2023

## 2023-10-06 DIAGNOSIS — H524 Presbyopia: Secondary | ICD-10-CM | POA: Diagnosis not present

## 2023-10-06 DIAGNOSIS — Z961 Presence of intraocular lens: Secondary | ICD-10-CM | POA: Diagnosis not present

## 2023-10-06 DIAGNOSIS — H3554 Dystrophies primarily involving the retinal pigment epithelium: Secondary | ICD-10-CM | POA: Diagnosis not present

## 2023-10-06 DIAGNOSIS — H47333 Pseudopapilledema of optic disc, bilateral: Secondary | ICD-10-CM | POA: Diagnosis not present

## 2023-10-06 DIAGNOSIS — H353131 Nonexudative age-related macular degeneration, bilateral, early dry stage: Secondary | ICD-10-CM | POA: Diagnosis not present

## 2023-10-06 DIAGNOSIS — H52223 Regular astigmatism, bilateral: Secondary | ICD-10-CM | POA: Diagnosis not present

## 2023-10-06 DIAGNOSIS — H43813 Vitreous degeneration, bilateral: Secondary | ICD-10-CM | POA: Diagnosis not present

## 2023-10-12 DIAGNOSIS — E538 Deficiency of other specified B group vitamins: Secondary | ICD-10-CM | POA: Diagnosis not present

## 2023-10-12 DIAGNOSIS — E7800 Pure hypercholesterolemia, unspecified: Secondary | ICD-10-CM | POA: Diagnosis not present

## 2023-10-18 DIAGNOSIS — F4323 Adjustment disorder with mixed anxiety and depressed mood: Secondary | ICD-10-CM | POA: Diagnosis not present

## 2023-10-18 DIAGNOSIS — F429 Obsessive-compulsive disorder, unspecified: Secondary | ICD-10-CM | POA: Diagnosis not present

## 2023-10-18 DIAGNOSIS — F419 Anxiety disorder, unspecified: Secondary | ICD-10-CM | POA: Diagnosis not present

## 2023-12-02 ENCOUNTER — Encounter (HOSPITAL_COMMUNITY): Payer: Self-pay

## 2023-12-02 ENCOUNTER — Ambulatory Visit (HOSPITAL_COMMUNITY)
Admission: RE | Admit: 2023-12-02 | Discharge: 2023-12-02 | Disposition: A | Discharge status: Home / Self Care | Payer: MEDICARE | Source: Ambulatory Visit | Attending: Emergency Medicine | Admitting: Emergency Medicine

## 2023-12-02 VITALS — BP 118/70 | HR 76 | Temp 97.9°F | Resp 18

## 2023-12-02 DIAGNOSIS — L03113 Cellulitis of right upper limb: Secondary | ICD-10-CM

## 2023-12-02 DIAGNOSIS — S40861A Insect bite (nonvenomous) of right upper arm, initial encounter: Secondary | ICD-10-CM | POA: Diagnosis not present

## 2023-12-02 DIAGNOSIS — W57XXXA Bitten or stung by nonvenomous insect and other nonvenomous arthropods, initial encounter: Secondary | ICD-10-CM

## 2023-12-02 MED ORDER — CLINDAMYCIN HCL 300 MG PO CAPS: 300.0000 mg | ORAL_CAPSULE | Freq: Three times a day (TID) | ORAL | 0 refills | Status: AC

## 2023-12-02 NOTE — Discharge Instructions (Signed)
 Use over the counter loratadine (brand name Claritin) or cetirzine (brand name zytec) to help control the itching feeling.

## 2023-12-02 NOTE — ED Triage Notes (Signed)
 Pt reports has a bite on right arm for over week form possible spider. Reports was getting larger and larger and then opened the other day like a pimple. Itches some and would like to have checked out. Been putting Neosporin on area.

## 2023-12-02 NOTE — ED Provider Notes (Signed)
 [facility]    CSN: [id number] Arrival date & time: 12/02/23  1044      History   Chief Complaint Chief Complaint  Patient presents with   Insect Bite    Infected and not getting better - Entered by patient    HPI Vicki Quinn is a 78 y.o. female. Insect bite to R posterior arm just superior to elbow x 1.5 weeks ago. Is red, swollen, itchy. Had a white head in the center that opened and drained pus, then it reformed, opened and drained again. Pt is concerned it is infected since it is not healing. She did not see the insect that bit her  HPI  Past Medical History:  Diagnosis Date   Acid reflux    Elevated cholesterol    Fibroid    HSV-1 infection    Osteopenia 11/2017   T score -2.1 FRAX 13% / 3.3%   Pneumonia 2013    Patient Active Problem List   Diagnosis Date Noted   Osteopenia 12/06/2022   Pain in lateral portion of right knee 10/22/2013   Elevated cholesterol    HSV-1 infection     Past Surgical History:  Procedure Laterality Date   BREAST SURGERY     Left breast cyst-Benign   mole excised     Benign   TONSILLECTOMY     VAGINAL HYSTERECTOMY      OB History     Gravida  0   Para  0   Term  0   Preterm  0   AB  0   Living  0      SAB  0   IAB  0   Ectopic  0   Multiple  0   Live Births  0            Home Medications    Prior to Admission medications   Medication Sig Start Date End Date Taking? Authorizing Provider  calcium carbonate (OS-CAL) 1250 (500 Ca) MG chewable tablet Chew by mouth. 01/05/18   [provider]  Calcium Carbonate-Vitamin D  (CALCIUM + D PO) Take 1 tablet by mouth daily.     [provider]  cholecalciferol (VITAMIN D3) 25 MCG (1000 UNIT) tablet Take 1,000 Units by mouth. 11/26/15   [provider]  clonazePAM (KLONOPIN) 0.5 MG tablet TAKE 1 TABLET BY MOUTH TWICE A DAY FOR 30 DAYS 06/28/22   [provider]  Cyanocobalamin  (VITAMIN B-12 PO) Take by mouth.     [provider]  famotidine (PEPCID AC) 10 MG tablet 1 tablet as needed Orally Twice a day    [provider]  Multiple Vitamin (MULTIVITAMIN PO) Take by mouth. +Folate    [provider]  omeprazole (PRILOSEC) 40 MG capsule Take 40 mg by mouth daily.    [provider]  rosuvastatin (CRESTOR) 10 MG tablet 1 tablet Orally Once a day for 90 days 06/27/23   [provider]  sertraline (ZOLOFT) 100 MG tablet Take 100 mg by mouth daily.    [provider]  Turmeric 500 MG CAPS as directed Orally    [provider]  valACYclovir  (VALTREX ) 1000 MG tablet TAKE 2 TABLETS AT ONSET OF COLD SORE,THEN 2 TABLETS 12 HOURS LATER ORALLY AS NEEDED Orally twice a day for 30 days 11/26/15   [provider]  zolpidem (AMBIEN) 10 MG tablet 1 tablet at bedtime as needed    [provider]    Family History Family History  Problem Relation Age of Onset   Heart disease Father    Breast cancer Maternal Grandmother        Age 35's   Cancer Maternal Grandfather        Prostate cancer    Social History Social History   Tobacco Use   Smoking status: Never   Smokeless tobacco: Never  Vaping Use   Vaping status: Never Used  Substance Use Topics   Alcohol use: Not Currently    Comment: occasional/socially   Drug use: No     Allergies   Sulfa antibiotics, Sulfamethoxazole-trimethoprim, Amoxicillin, and Sulfamethoxazole   Review of Systems Review of Systems   Physical Exam Triage Vital Signs ED Triage Vitals  Encounter Vitals Group     BP 12/02/23 1056 118/70     Girls Systolic BP Percentile --      Girls Diastolic BP Percentile --      Boys Systolic BP Percentile --      Boys Diastolic BP Percentile --      Pulse Rate 12/02/23 1056 76     Resp 12/02/23 1056 18     Temp 12/02/23 1056 97.9 F (36.6 C)     Temp Source 12/02/23 1056 Oral     SpO2 12/02/23 1056 96 %     Weight --      Height --      Head  Circumference --      Peak Flow --      Pain Score 12/02/23 1055 0     Pain Loc --      Pain Education --      Exclude from Growth Chart --    No data found.  Updated Vital Signs BP 118/70 (BP Location: Left Arm)   Pulse 76   Temp 97.9 F (36.6 C) (Oral)   Resp 18   SpO2 96%   Visual Acuity Right Eye Distance:   Left Eye Distance:   Bilateral Distance:    Right Eye Near:   Left Eye Near:    Bilateral Near:     Physical Exam Constitutional:      Appearance: Normal appearance. She is not ill-appearing.  Pulmonary:     Effort: Pulmonary effort is normal.  Skin:    General: Skin is warm and dry.     Findings: Erythema present.      Neurological:     Mental Status: She is alert.      UC Treatments / Results  Labs (all labs ordered are listed, but only abnormal results are displayed) Labs Reviewed - No data to display  EKG   Radiology No results found.  Procedures Procedures (including critical care time)  Medications Ordered in UC Medications - No data to display  Initial Impression / Assessment and Plan / UC Course  I have reviewed the triage vital signs and the nursing notes.  Pertinent labs & imaging results that were available during my care of the patient were reviewed by me and considered in my medical decision making (see chart for details).    Exam and sx c/w secondary cellulitis after insect bite. Will tx for infection. Pt is allergic to sulfa and amox, will try clindamycin   Final Clinical Impressions(s) / UC Diagnoses   Final diagnoses:  Cellulitis of right upper extremity  Insect bite of right upper arm, initial encounter     Discharge Instructions      Use over the counter loratadine (brand name Claritin) or cetirzine (brand name zytec) to help  control the itching feeling.        ED Prescriptions     Medication Sig Dispense Auth. Provider   clindamycin  (CLEOCIN ) 300 MG capsule Take 1 capsule (300 mg total) by mouth 3  (three) times daily for 7 days. 21 capsule Richad Jon HERO, NP      PDMP not reviewed this encounter.   Richad Jon HERO, NP 12/10/23 [phone]

## 2023-12-16 DIAGNOSIS — Z23 Encounter for immunization: Secondary | ICD-10-CM | POA: Diagnosis not present

## 2023-12-26 DIAGNOSIS — Z23 Encounter for immunization: Secondary | ICD-10-CM | POA: Diagnosis not present

## 2024-01-26 DIAGNOSIS — J069 Acute upper respiratory infection, unspecified: Secondary | ICD-10-CM | POA: Diagnosis not present

## 2024-01-30 DIAGNOSIS — Z1231 Encounter for screening mammogram for malignant neoplasm of breast: Secondary | ICD-10-CM | POA: Diagnosis not present

## 2024-02-20 DIAGNOSIS — F429 Obsessive-compulsive disorder, unspecified: Secondary | ICD-10-CM | POA: Diagnosis not present

## 2024-02-20 DIAGNOSIS — F419 Anxiety disorder, unspecified: Secondary | ICD-10-CM | POA: Diagnosis not present

## 2024-02-20 DIAGNOSIS — F4323 Adjustment disorder with mixed anxiety and depressed mood: Secondary | ICD-10-CM | POA: Diagnosis not present
